# Patient Record
Sex: Male | Born: 1985 | Race: White | Hispanic: No | Marital: Married | Smoking: Current every day smoker
Health system: Southern US, Community
[De-identification: ages and names within clinical notes are randomized; demographics above are authoritative.]

## PROBLEM LIST (undated history)

## (undated) DIAGNOSIS — K219 Gastro-esophageal reflux disease without esophagitis: Secondary | ICD-10-CM

## (undated) HISTORY — PX: HAND SURGERY: SHX662

## (undated) HISTORY — PX: COSMETIC SURGERY: SHX468

---

## 2009-06-07 ENCOUNTER — Emergency Department (HOSPITAL_COMMUNITY): Admission: EM | Admit: 2009-06-07 | Discharge: 2009-06-07 | Payer: Self-pay | Admitting: Emergency Medicine

## 2009-08-27 ENCOUNTER — Emergency Department (HOSPITAL_COMMUNITY): Admission: EM | Admit: 2009-08-27 | Discharge: 2009-08-27 | Payer: Self-pay | Admitting: Emergency Medicine

## 2009-08-29 ENCOUNTER — Emergency Department (HOSPITAL_COMMUNITY): Admission: EM | Admit: 2009-08-29 | Discharge: 2009-08-29 | Payer: Self-pay | Admitting: Emergency Medicine

## 2009-09-16 ENCOUNTER — Emergency Department (HOSPITAL_COMMUNITY): Admission: EM | Admit: 2009-09-16 | Discharge: 2009-09-16 | Payer: Self-pay | Admitting: *Deleted

## 2011-02-13 ENCOUNTER — Emergency Department (HOSPITAL_COMMUNITY)
Admission: EM | Admit: 2011-02-13 | Discharge: 2011-02-13 | Disposition: A | Discharge status: Home / Self Care | Payer: Self-pay | Attending: Emergency Medicine | Admitting: Emergency Medicine

## 2011-02-13 DIAGNOSIS — K089 Disorder of teeth and supporting structures, unspecified: Secondary | ICD-10-CM | POA: Insufficient documentation

## 2011-03-07 LAB — RAPID URINE DRUG SCREEN, HOSP PERFORMED
Amphetamines: NOT DETECTED
Barbiturates: NOT DETECTED
Benzodiazepines: NOT DETECTED
Cocaine: NOT DETECTED
Opiates: NOT DETECTED
Tetrahydrocannabinol: POSITIVE — AB

## 2011-03-07 LAB — BASIC METABOLIC PANEL
BUN: 3 mg/dL — ABNORMAL LOW (ref 6–23)
CO2: 24 mEq/L (ref 19–32)
Calcium: 9.2 mg/dL (ref 8.4–10.5)
Chloride: 106 mEq/L (ref 96–112)
Creatinine, Ser: 0.83 mg/dL (ref 0.4–1.5)
GFR calc Af Amer: >60 mL/min (ref >60)
GFR calc non Af Amer: >60 mL/min (ref >60)
Glucose, Bld: 99 mg/dL (ref 70–99)
Potassium: 3.3 mEq/L — ABNORMAL LOW (ref 3.5–5.1)
Sodium: 140 mEq/L (ref 135–145)

## 2011-03-07 LAB — ETHANOL: Alcohol, Ethyl (B): 118 mg/dL — ABNORMAL HIGH (ref 0–10)

## 2011-03-08 LAB — POCT I-STAT, CHEM 8
BUN: 6 mg/dL (ref 6–23)
Calcium, Ion: 1.15 mmol/L (ref 1.12–1.32)
Chloride: 102 mEq/L (ref 96–112)
Creatinine, Ser: 1 mg/dL (ref 0.4–1.5)
Glucose, Bld: 91 mg/dL (ref 70–99)
HCT: 52 % (ref 39.0–52.0)
Hemoglobin: 17.7 g/dL — ABNORMAL HIGH (ref 13.0–17.0)
Potassium: 4.2 mEq/L (ref 3.5–5.1)
Sodium: 138 mEq/L (ref 135–145)
TCO2: 26 mmol/L (ref 0–100)

## 2011-03-08 LAB — CBC
HCT: 47.3 % (ref 39.0–52.0)
Hemoglobin: 16.2 g/dL (ref 13.0–17.0)
MCHC: 34.2 g/dL (ref 30.0–36.0)
MCV: 87.3 fL (ref 78.0–100.0)
Platelets: 141 10*3/uL — ABNORMAL LOW (ref 150–400)
RBC: 5.42 MIL/uL (ref 4.22–5.81)
RDW: 13.6 % (ref 11.5–15.5)
WBC: 18.8 10*3/uL — ABNORMAL HIGH (ref 4.0–10.5)

## 2011-03-08 LAB — DIFFERENTIAL
Basophils Absolute: 0.1 10*3/uL (ref 0.0–0.1)
Basophils Relative: 0 % (ref 0–1)
Eosinophils Absolute: 0.4 10*3/uL (ref 0.0–0.7)
Eosinophils Relative: 2 % (ref 0–5)
Lymphocytes Relative: 12 % (ref 12–46)
Lymphs Abs: 2.2 10*3/uL (ref 0.7–4.0)
Monocytes Absolute: 1.3 10*3/uL — ABNORMAL HIGH (ref 0.1–1.0)
Monocytes Relative: 7 % (ref 3–12)
Neutro Abs: 14.9 10*3/uL — ABNORMAL HIGH (ref 1.7–7.7)
Neutrophils Relative %: 79 % — ABNORMAL HIGH (ref 43–77)

## 2011-12-14 ENCOUNTER — Encounter (HOSPITAL_COMMUNITY): Payer: Self-pay | Admitting: *Deleted

## 2011-12-14 ENCOUNTER — Emergency Department (HOSPITAL_COMMUNITY)
Admission: EM | Admit: 2011-12-14 | Discharge: 2011-12-14 | Disposition: A | Discharge status: Home / Self Care | Payer: Self-pay | Attending: Emergency Medicine | Admitting: Emergency Medicine

## 2011-12-14 DIAGNOSIS — R51 Headache: Secondary | ICD-10-CM | POA: Insufficient documentation

## 2011-12-14 DIAGNOSIS — R112 Nausea with vomiting, unspecified: Secondary | ICD-10-CM | POA: Insufficient documentation

## 2011-12-14 DIAGNOSIS — R109 Unspecified abdominal pain: Secondary | ICD-10-CM | POA: Insufficient documentation

## 2011-12-14 MED ORDER — ONDANSETRON HCL 4 MG/2ML IJ SOLN
4.0000 mg | Freq: Once | INTRAMUSCULAR | Status: AC
  Administered 2011-12-14: 4 mg via INTRAVENOUS
  Filled 2011-12-14: qty 2

## 2011-12-14 MED ORDER — HYDROMORPHONE HCL PF 1 MG/ML IJ SOLN
1.0000 mg | Freq: Once | INTRAMUSCULAR | Status: DC
  Filled 2011-12-14: qty 1

## 2011-12-14 MED ORDER — DICYCLOMINE HCL 20 MG PO TABS: 20.0000 mg | ORAL_TABLET | Freq: Four times a day (QID) | ORAL | Status: DC | PRN

## 2011-12-14 MED ORDER — ONDANSETRON HCL 4 MG PO TABS: 4.0000 mg | ORAL_TABLET | Freq: Four times a day (QID) | ORAL | Status: AC

## 2011-12-14 MED ORDER — SODIUM CHLORIDE 0.9 % IV BOLUS (SEPSIS)
2000.0000 mL | Freq: Once | INTRAVENOUS | Status: AC
  Administered 2011-12-14: 1000 mL via INTRAVENOUS

## 2011-12-14 NOTE — ED Notes (Addendum)
Pt states was sent home from work last night secondary to emesis. Pt reports c/o "blood in vomit at 1700 tonight" and increasing  abd pain. Unsure of last BM,  States seen "blood "in last stool does have hemmoroids.  Pt also c/o of headache.

## 2011-12-14 NOTE — ED Notes (Signed)
Pt not tolerating IV site; pt became diaphoretic with placement; pt at this time still anxious and diaphoretic with increased Respirations.  MD aware. Zofran given for nausea. Dilaudid not given at this time. MD aware Will monitor. Spouse at bedside. Pt responding to claming techniques

## 2011-12-14 NOTE — ED Provider Notes (Signed)
History    25yM with n/v. Onset yesterday. Multiple episodes. Noticed today after vomiting spit up small amount of blood. Mild abdominal pain. "Just feels really sore." Continued through today. No urinary complaints. No diarrhea. No fever or chills. Mild HA. No CP or SOB. Denies hx of abdominal surgery. No urinary complaints.  CSN: [id number]  Arrival date & time 12/14/11  1900   First MD Initiated Contact with Patient 12/14/11 2108      Chief Complaint  Patient presents with  . Emesis  . Abdominal Pain    (Consider location/radiation/quality/duration/timing/severity/associated sxs/prior treatment) HPI  History reviewed. No pertinent past medical history.  Past Surgical History  Procedure Date  . Cosmetic surgery     No family history on file.  History  Substance Use Topics  . Smoking status: Current Everyday Smoker -- 1.0 packs/day    Types: Cigarettes  . Smokeless tobacco: Not on file  . Alcohol Use: No      Review of Systems   Review of symptoms negative unless otherwise noted in HPI.   Allergies  Review of patient's allergies indicates not on file.  Home Medications  No current outpatient prescriptions on file.  BP 127/84  Pulse 71  Temp(Src) 98 F (36.7 C) (Oral)  Resp 18  Ht 5\' 11"  (1.803 m)  Wt 225 lb (102.059 kg)  BMI 31.38 kg/m2  SpO2 100%  Physical Exam  Nursing note and vitals reviewed. Constitutional: He appears well-developed and well-nourished. No distress.       Sitting up in bed. nad.  HENT:  Head: Normocephalic and atraumatic.  Eyes: Conjunctivae are normal. Right eye exhibits no discharge. Left eye exhibits no discharge.  Neck: Neck supple.  Cardiovascular: Normal rate, regular rhythm and normal heart sounds.  Exam reveals no gallop and no friction rub.   No murmur heard. Pulmonary/Chest: Effort normal and breath sounds normal. No respiratory distress.  Abdominal: Soft. He exhibits no distension and no mass. There is no  tenderness. There is no guarding.  Genitourinary:       No cva tenderness  Musculoskeletal: He exhibits no edema and no tenderness.  Neurological: He is alert.  Skin: Skin is warm and dry. He is not diaphoretic.  Psychiatric: He has a normal mood and affect. His behavior is normal. Thought content normal.    ED Course  Procedures (including critical care time)  Labs Reviewed - No data to display No results found.   1. Nausea and vomiting       MDM  25yM with vomiting for about a day. Abdomen benign. Denies acute abdominal surgical process. Likley viral illness. Better after meds and IVF. Plan continues symptomatic tx. Return precautions discussed.        Raeford Razor, MD 12/20/11 1034

## 2011-12-14 NOTE — ED Notes (Signed)
Pt stable at discharge IV site from infiltrate less edema noted instructions given to pt/family. No redness noted. App 200 cc NS infilrated

## 2015-01-09 ENCOUNTER — Encounter (HOSPITAL_COMMUNITY): Payer: Self-pay | Admitting: *Deleted

## 2015-01-09 ENCOUNTER — Emergency Department (HOSPITAL_COMMUNITY)
Admission: EM | Admit: 2015-01-09 | Discharge: 2015-01-09 | Discharge status: Against Medical Advice | Payer: Self-pay | Attending: Emergency Medicine | Admitting: Emergency Medicine

## 2015-01-09 ENCOUNTER — Emergency Department (HOSPITAL_COMMUNITY): Payer: Self-pay

## 2015-01-09 DIAGNOSIS — R200 Anesthesia of skin: Secondary | ICD-10-CM | POA: Insufficient documentation

## 2015-01-09 DIAGNOSIS — Z720 Tobacco use: Secondary | ICD-10-CM | POA: Insufficient documentation

## 2015-01-09 DIAGNOSIS — R079 Chest pain, unspecified: Secondary | ICD-10-CM | POA: Insufficient documentation

## 2015-01-09 LAB — CBC WITH DIFFERENTIAL/PLATELET
Basophils Absolute: 0.1 K/uL (ref 0.0–0.1)
Basophils Relative: 1 % (ref 0–1)
Eosinophils Absolute: 0.5 K/uL (ref 0.0–0.7)
Eosinophils Relative: 7 % — ABNORMAL HIGH (ref 0–5)
HCT: 46.4 % (ref 39.0–52.0)
Hemoglobin: 15.4 g/dL (ref 13.0–17.0)
Lymphocytes Relative: 29 % (ref 12–46)
Lymphs Abs: 2.2 K/uL (ref 0.7–4.0)
MCH: 28.3 pg (ref 26.0–34.0)
MCHC: 33.2 g/dL (ref 30.0–36.0)
MCV: 85.3 fL (ref 78.0–100.0)
Monocytes Absolute: 0.7 K/uL (ref 0.1–1.0)
Monocytes Relative: 9 % (ref 3–12)
Neutro Abs: 4.1 K/uL (ref 1.7–7.7)
Neutrophils Relative %: 54 % (ref 43–77)
Platelets: 170 K/uL (ref 150–400)
RBC: 5.44 MIL/uL (ref 4.22–5.81)
RDW: 13.6 % (ref 11.5–15.5)
WBC: 7.5 K/uL (ref 4.0–10.5)

## 2015-01-09 LAB — COMPREHENSIVE METABOLIC PANEL WITH GFR
ALT: 33 U/L (ref 0–53)
AST: 29 U/L (ref 0–37)
Albumin: 4.5 g/dL (ref 3.5–5.2)
Alkaline Phosphatase: 96 U/L (ref 39–117)
Anion gap: 2 — ABNORMAL LOW (ref 5–15)
BUN: 9 mg/dL (ref 6–23)
CO2: 26 mmol/L (ref 19–32)
Calcium: 9.2 mg/dL (ref 8.4–10.5)
Chloride: 110 mmol/L (ref 96–112)
Creatinine, Ser: 0.83 mg/dL (ref 0.50–1.35)
GFR calc Af Amer: >90 mL/min (ref >90)
GFR calc non Af Amer: >90 mL/min (ref >90)
Glucose, Bld: 95 mg/dL (ref 70–99)
Potassium: 4.2 mmol/L (ref 3.5–5.1)
Sodium: 138 mmol/L (ref 135–145)
Total Bilirubin: 0.5 mg/dL (ref 0.3–1.2)
Total Protein: 7.9 g/dL (ref 6.0–8.3)

## 2015-01-09 LAB — TROPONIN I: Troponin I: <0.03 ng/mL (ref <0.031)

## 2015-01-09 NOTE — ED Notes (Signed)
Lt chest pain onset this am,  Has had numbness in hands for 2 days,

## 2018-08-12 ENCOUNTER — Other Ambulatory Visit: Payer: Self-pay

## 2018-08-12 ENCOUNTER — Encounter (HOSPITAL_COMMUNITY): Payer: Self-pay | Admitting: Emergency Medicine

## 2018-08-12 ENCOUNTER — Emergency Department (HOSPITAL_COMMUNITY): Payer: Self-pay

## 2018-08-12 ENCOUNTER — Emergency Department (HOSPITAL_COMMUNITY)
Admission: EM | Admit: 2018-08-12 | Discharge: 2018-08-12 | Disposition: A | Discharge status: Home / Self Care | Payer: Self-pay | Attending: Emergency Medicine | Admitting: Emergency Medicine

## 2018-08-12 DIAGNOSIS — W25XXXA Contact with sharp glass, initial encounter: Secondary | ICD-10-CM | POA: Insufficient documentation

## 2018-08-12 DIAGNOSIS — Y999 Unspecified external cause status: Secondary | ICD-10-CM | POA: Insufficient documentation

## 2018-08-12 DIAGNOSIS — Y93H2 Activity, gardening and landscaping: Secondary | ICD-10-CM | POA: Insufficient documentation

## 2018-08-12 DIAGNOSIS — Z23 Encounter for immunization: Secondary | ICD-10-CM | POA: Insufficient documentation

## 2018-08-12 DIAGNOSIS — S81842A Puncture wound with foreign body, left lower leg, initial encounter: Secondary | ICD-10-CM | POA: Insufficient documentation

## 2018-08-12 DIAGNOSIS — S81812A Laceration without foreign body, left lower leg, initial encounter: Secondary | ICD-10-CM

## 2018-08-12 DIAGNOSIS — W208XXA Other cause of strike by thrown, projected or falling object, initial encounter: Secondary | ICD-10-CM | POA: Insufficient documentation

## 2018-08-12 DIAGNOSIS — T148XXA Other injury of unspecified body region, initial encounter: Secondary | ICD-10-CM

## 2018-08-12 DIAGNOSIS — Y92007 Garden or yard of unspecified non-institutional (private) residence as the place of occurrence of the external cause: Secondary | ICD-10-CM | POA: Insufficient documentation

## 2018-08-12 MED ORDER — POVIDONE-IODINE 10 % EX SOLN
CUTANEOUS | Status: AC
  Administered 2018-08-12: 09:00:00
  Filled 2018-08-12: qty 15

## 2018-08-12 MED ORDER — LIDOCAINE-EPINEPHRINE (PF) 2 %-1:200000 IJ SOLN
10.0000 mL | Freq: Once | INTRAMUSCULAR | Status: AC
  Administered 2018-08-12: 10 mL
  Filled 2018-08-12: qty 20

## 2018-08-12 MED ORDER — TETANUS-DIPHTH-ACELL PERTUSSIS 5-2.5-18.5 LF-MCG/0.5 IM SUSP
INTRAMUSCULAR | Status: AC
  Administered 2018-08-12: 0.5 mL via INTRAMUSCULAR
  Filled 2018-08-12: qty 0.5

## 2018-08-12 MED ORDER — TETANUS-DIPHTH-ACELL PERTUSSIS 5-2.5-18.5 LF-MCG/0.5 IM SUSP
0.5000 mL | Freq: Once | INTRAMUSCULAR | Status: AC
  Administered 2018-08-12: 0.5 mL via INTRAMUSCULAR

## 2018-08-12 NOTE — Discharge Instructions (Signed)
Return if any sign of infection °

## 2018-08-12 NOTE — ED Triage Notes (Signed)
Pt reports was using a weed eater yesterday and reports shattered a glass mason jar nearby. Pt reports "there may be glass in my leg." pt able to bear weight but with pain. Bleeding controlled at this time.

## 2018-08-12 NOTE — ED Notes (Signed)
Pt returned from imaging.

## 2018-08-12 NOTE — ED Notes (Signed)
EDP at bedside  

## 2018-08-12 NOTE — ED Provider Notes (Signed)
[facility] EMERGENCY DEPARTMENT Provider Note   CSN: [id number] Arrival date & time: 08/12/18  2094     History   Chief Complaint Chief Complaint  Patient presents with  . Extremity Laceration    HPI Dustin Dustin [name] is a 32 y.o. male.  The history is provided by the patient. No language interpreter was used.  Leg Pain   The current episode started yesterday. The problem occurs constantly. The problem has been gradually worsening. The pain is present in the left lower leg. The quality of the pain is described as aching. The pain is moderate. Associated symptoms include stiffness. He has tried nothing for the symptoms. The treatment provided mild relief. There has been a history of trauma.   Pt reports he was weedeating yesterday and he thinks a piece of glass went into his leg.  History reviewed. No pertinent past medical history.  There are no active problems to display for this patient.   Past Surgical History:  Procedure Laterality Date  . COSMETIC SURGERY          Home Medications    Prior to Admission medications   Medication Sig Start Date End Date Taking? Authorizing Provider  dicyclomine (BENTYL) 20 MG tablet Take 1 tablet (20 mg total) by mouth every 6 (six) hours as needed. 12/14/11 12/13/12  Raeford Razor, MD    Family History History reviewed. No pertinent family history.  Social History Social History   Tobacco Use  . Smoking status: Current Every Day Smoker    Packs/day: 1.00    Types: Cigarettes  . Smokeless tobacco: Never Used  Substance Use Topics  . Alcohol use: No  . Drug use: Yes    Types: Marijuana    Comment: last use 08/11/18     Allergies   Patient has no known allergies.   Review of Systems Review of Systems  Musculoskeletal: Positive for stiffness.  All other systems reviewed and are negative.    Physical Exam Updated Vital Signs BP 127/88 (BP Location: Left Arm)   Pulse 61   Temp 98.3 F (36.8 C) (Oral)   Resp 18    Ht 5\' 10"  (1.778 m)   Wt 99.8 kg   SpO2 100%   BMI 31.57 kg/m   Physical Exam  Constitutional: He is oriented to person, place, and time. He appears well-developed and well-nourished.  HENT:  Dustin [name]: Normocephalic.  Eyes: EOM are normal.  Neck: Normal range of motion.  Cardiovascular: Normal rate.  Pulmonary/Chest: Effort normal.  Abdominal: He exhibits no distension.  Musculoskeletal:  Puncture wound left anterior lower leg,   Neurological: He is alert and oriented to person, place, and time.  Psychiatric: He has a normal mood and affect.  Nursing note and vitals reviewed.    ED Treatments / Results  Labs (all labs ordered are listed, but only abnormal results are displayed) Labs Reviewed - No data to display  EKG None  Radiology Dg Tibia/fibula Left  Result Date: 08/12/2018 CLINICAL DATA:  Laceration.  Rule out foreign body EXAM: LEFT TIBIA AND FIBULA - 2 VIEW COMPARISON:  None. FINDINGS: Negative for fracture 1 cm rectangular foreign body in the anterior soft tissues of the distal lower leg. This is anterior to the tibia and may represent a piece of glass. No other foreign body. No gas in the soft tissues. IMPRESSION: 1 cm foreign body in the soft tissues anterior to the distal tibia, most likely glass. Negative for fracture. Electronically Signed   By: Leonette Most  Chestine Spore M.D.   On: 08/12/2018 09:08   MDM  Xray shows glass in soft tissue,  Glass removed,  Pt advised to follow up with primary, return if any problem.s Procedures .Foreign Body Removal Date/Time: 08/12/2018 10:36 AM Performed by: Elson Areas, PA-C Authorized by: Elson Areas, PA-C  Consent: Verbal consent obtained. Consent given by: patient Patient consent: the patient's understanding of the procedure matches consent given Imaging studies: imaging studies available Time out: Immediately prior to procedure a "time out" was called to verify the correct patient, procedure, equipment, support staff and  site/side marked as required. Body area: skin Anesthesia: local infiltration  Anesthesia: Local Anesthetic: lidocaine 1% with epinephrine Anesthetic total: 3 mL  Sedation: Patient sedated: no  Localization method: probed Removal mechanism: hemostat Dressing: dressing applied Tendon involvement: none Depth: deep Complexity: simple 1 objects recovered. Objects recovered: glass Post-procedure assessment: foreign body removed Patient tolerance: Patient tolerated the procedure well with no immediate complications   (including critical care time)  Medications Ordered in ED Medications  lidocaine-EPINEPHrine (XYLOCAINE W/EPI) 2 %-1:200000 (PF) injection 10 mL (10 mLs Infiltration Given by Other 08/12/18 0924)  povidone-iodine (BETADINE) 10 % external solution (  Given 08/12/18 0924)  Tdap (BOOSTRIX) injection 0.5 mL (0.5 mLs Intramuscular Given 08/12/18 0953)     Initial Impression / Assessment and Plan / ED Course  I have reviewed the triage vital signs and the nursing notes.  Pertinent labs & imaging results that were available during my care of the patient were reviewed by me and considered in my medical decision making (see chart for details).    An After Visit Summary was printed and given to the patient.    Final Clinical Impressions(s) / ED Diagnoses   Final diagnoses:  Foreign body in skin  Laceration of left lower extremity, initial encounter    ED Discharge Orders    None       Elson Areas, Cordelia Poche 08/12/18 1037    Samuel Jester, DO 08/16/18 1333

## 2018-08-12 NOTE — ED Notes (Signed)
Suture cart placed at bedside. 

## 2018-11-18 ENCOUNTER — Ambulatory Visit: Payer: Self-pay | Admitting: Family Medicine

## 2018-12-10 ENCOUNTER — Encounter: Payer: Self-pay | Admitting: Family Medicine

## 2018-12-10 ENCOUNTER — Ambulatory Visit (INDEPENDENT_AMBULATORY_CARE_PROVIDER_SITE_OTHER): Payer: Self-pay | Admitting: Family Medicine

## 2018-12-10 VITALS — BP 116/67 | HR 88 | Temp 98.8°F | Resp 16 | Ht 71.0 in | Wt 231.0 lb

## 2018-12-10 DIAGNOSIS — H60333 Swimmer's ear, bilateral: Secondary | ICD-10-CM

## 2018-12-10 DIAGNOSIS — L409 Psoriasis, unspecified: Secondary | ICD-10-CM

## 2018-12-10 DIAGNOSIS — K429 Umbilical hernia without obstruction or gangrene: Secondary | ICD-10-CM

## 2018-12-10 MED ORDER — CIPROFLOXACIN-DEXAMETHASONE 0.3-0.1 % OT SUSP: 4.0000 [drp] | Freq: Two times a day (BID) | OTIC | 0 refills | Status: AC

## 2018-12-10 MED ORDER — FLUOCINONIDE-E 0.05 % EX CREA: 1.0000 "application " | TOPICAL_CREAM | Freq: Two times a day (BID) | CUTANEOUS | 2 refills | Status: DC

## 2018-12-10 MED FILL — CIPRODEX OTIC SUSPENSION: 0.3-0.1 | 14 days supply | Qty: 8 | Fill #0

## 2018-12-10 MED FILL — FLUOCINONIDE EMULSIFIED BAS: 0.05 | 15 days supply | Qty: 30 | Fill #0

## 2018-12-10 NOTE — Patient Instructions (Signed)

## 2018-12-10 NOTE — Progress Notes (Signed)
[facility]  New Patient Encounter Provider: Mike Gipndre Karon Heckendorn, FNP    ZOX:[id number]  [id number]:[id number]  DOB - 08/19/1986  SUBJECTIVE:   Dustin [name], is a 33 y.o. male who presents to establish care with this clinic.   Current problems/concerns:  Patient states that he has noticed a foul-smelling discharge coming from his bilateral ears.  He states he cleans his ears daily with a Bobby pin.  Patient states that he has decreased hearing in bilateral ears.  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  He does not wear ear protection.  Patient also states that he has a history of an umbilical hernia for the past 2 years with intermittent tingling pain.  Patient states that the pain has increased within the past few weeks.  He does report straining with bowel movements.  He often lifts heavy objects. Patient states that he has not been seen by a provider since he was a child.  He is not fasting today for labs.  Also states history of psoriasis and multiple moles.  No Known Allergies History reviewed. No pertinent past medical history. No current outpatient medications on file prior to visit.   No current facility-administered medications on file prior to visit.    History reviewed. No pertinent family history. Social History   Socioeconomic History  . Marital status: Married    Spouse name: Not on file  . Number of children: Not on file  . Years of education: Not on file  . Highest education level: Not on file  Occupational History  . Not on file  Social Needs  . Financial resource strain: Not on file  . Food insecurity:    Worry: Not on file    Inability: Not on file  . Transportation needs:    Medical: Not on file    Non-medical: Not on file  Tobacco Use  . Smoking status: Current Every Day Smoker    Packs/day: 2.00    Types: Cigarettes  . Smokeless tobacco: Never Used  Substance and Sexual Activity  . Alcohol  use: No  . Drug use: Yes    Types: Marijuana    Comment: daily  . Sexual activity: Yes    Birth control/protection: Condom  Lifestyle  . Physical activity:    Days per week: Not on file    Minutes per session: Not on file  . Stress: Not on file  Relationships  . Social connections:    Talks on phone: Not on file    Gets together: Not on file    Attends religious service: Not on file    Active member of club or organization: Not on file    Attends meetings of clubs or organizations: Not on file    Relationship status: Not on file  . Intimate partner violence:    Fear of current or ex partner: Not on file    Emotionally abused: Not on file    Physically abused: Not on file    Forced sexual activity: Not on file  Other Topics Concern  . Not on file  Social History Narrative  . Not on file    Review of Systems  Constitutional: Negative.   HENT: Positive for ear discharge, ear pain and hearing loss.   Eyes: Negative.   Respiratory: Negative.   Cardiovascular: Negative.   Gastrointestinal: Positive for abdominal pain.  Genitourinary: Negative.   Musculoskeletal: Negative.   Skin: Positive for rash (Psoriatic rash  noted to left leg).  Neurological: Negative.   Psychiatric/Behavioral: Negative.      OBJECTIVE:    BP 116/67 (BP Location: Right Arm, Patient Position: Sitting, Cuff Size: Large)   Pulse 88   Temp 98.8 F (37.1 C) (Oral)   Resp 16   Ht 5\' 11"  (1.803 m)   Wt 231 lb (104.8 kg)   SpO2 100%   BMI 32.22 kg/m   Physical Exam  Constitutional: He is oriented to person, place, and time and well-developed, well-nourished, and in no distress. No distress.  HENT:  Head: Normocephalic and atraumatic.  Right Ear: Hearing normal. There is drainage, swelling and tenderness.  Left Ear: Hearing normal. There is drainage, swelling and tenderness.  Bilateral TMs unable to visualize due to swelling of the ear canals. Fluffy white to yellow exudate noted bilaterally.     Eyes: Pupils are equal, round, and reactive to light. Conjunctivae and EOM are normal.  Neck: Normal range of motion. Neck supple.  Cardiovascular: Normal rate, regular rhythm and intact distal pulses. Exam reveals no gallop and no friction rub.  No murmur heard. Pulmonary/Chest: Effort normal and breath sounds normal. No respiratory distress. He has no wheezes.  Abdominal: Soft. Bowel sounds are normal. There is abdominal tenderness in the periumbilical area. A hernia is present. Hernia confirmed positive in the umbilical area.  Musculoskeletal: Normal range of motion.        General: No tenderness or edema.  Lymphadenopathy:    He has no cervical adenopathy.  Neurological: He is alert and oriented to person, place, and time. Gait normal.  Skin: Skin is warm and dry.     Psychiatric: Mood, memory, affect and judgment normal.  Nursing note and vitals reviewed.    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  - ciprofloxacin-dexamethasone (CIPRODEX) OTIC suspension; Place 4 drops into both ears 2 (two) times daily for 14 days.  Dispense: 7.5 mL; Refill: 0  2. Psoriasis Patient states that he has tried creams in the past without relief. Will try lidex.  - fluocinonide-emollient (LIDEX-E) 0.05 % cream; Apply 1 application topically 2 (two) times daily.  Dispense: 30 g; Refill: 2  3. Umbilical hernia without obstruction and without gangrene Patient advised to apply for orange card and cone discount so that he can be referred for further evaluation.  - US Abdomen Complete; Future  Return in about 2 weeks (around 12/24/2018) for ear.  The patient was given clear instructions to go to ER or return to medical center if symptoms don't improve, worsen or new problems develop. The patient verbalized understanding. The patient was told to call to get lab results if they haven't heard anything in the next week.     This note has been created with  Education officer, environmental. Any transcriptional errors are unintentional.   Ms. Andr L. Riley Lam, FNP-BC [facility] [address] [place], [place] [address] [phone]

## 2018-12-18 ENCOUNTER — Ambulatory Visit (HOSPITAL_COMMUNITY): Payer: Self-pay

## 2018-12-22 ENCOUNTER — Other Ambulatory Visit: Payer: Self-pay | Admitting: Family Medicine

## 2018-12-22 ENCOUNTER — Ambulatory Visit (HOSPITAL_COMMUNITY)
Admission: RE | Admit: 2018-12-22 | Discharge: 2018-12-22 | Disposition: A | Discharge status: Home / Self Care | Payer: Self-pay | Source: Ambulatory Visit | Attending: Family Medicine | Admitting: Family Medicine

## 2018-12-22 DIAGNOSIS — K429 Umbilical hernia without obstruction or gangrene: Secondary | ICD-10-CM

## 2018-12-22 DIAGNOSIS — K4090 Unilateral inguinal hernia, without obstruction or gangrene, not specified as recurrent: Secondary | ICD-10-CM

## 2018-12-25 ENCOUNTER — Ambulatory Visit: Payer: Self-pay | Admitting: Family Medicine

## 2019-01-01 ENCOUNTER — Ambulatory Visit: Payer: Self-pay | Admitting: Family Medicine

## 2019-01-06 ENCOUNTER — Emergency Department (HOSPITAL_COMMUNITY): Payer: Self-pay

## 2019-01-06 ENCOUNTER — Emergency Department (HOSPITAL_COMMUNITY)
Admission: EM | Admit: 2019-01-06 | Discharge: 2019-01-06 | Disposition: A | Discharge status: Home / Self Care | Payer: Self-pay | Attending: Emergency Medicine | Admitting: Emergency Medicine

## 2019-01-06 ENCOUNTER — Encounter (HOSPITAL_COMMUNITY): Payer: Self-pay | Admitting: Emergency Medicine

## 2019-01-06 ENCOUNTER — Other Ambulatory Visit: Payer: Self-pay

## 2019-01-06 DIAGNOSIS — Y929 Unspecified place or not applicable: Secondary | ICD-10-CM | POA: Insufficient documentation

## 2019-01-06 DIAGNOSIS — W51XXXA Accidental striking against or bumped into by another person, initial encounter: Secondary | ICD-10-CM | POA: Insufficient documentation

## 2019-01-06 DIAGNOSIS — Y9383 Activity, rough housing and horseplay: Secondary | ICD-10-CM | POA: Insufficient documentation

## 2019-01-06 DIAGNOSIS — S62612A Displaced fracture of proximal phalanx of right middle finger, initial encounter for closed fracture: Secondary | ICD-10-CM | POA: Insufficient documentation

## 2019-01-06 DIAGNOSIS — F17210 Nicotine dependence, cigarettes, uncomplicated: Secondary | ICD-10-CM | POA: Insufficient documentation

## 2019-01-06 DIAGNOSIS — Y999 Unspecified external cause status: Secondary | ICD-10-CM | POA: Insufficient documentation

## 2019-01-06 DIAGNOSIS — J45909 Unspecified asthma, uncomplicated: Secondary | ICD-10-CM | POA: Insufficient documentation

## 2019-01-06 MED ORDER — OXYCODONE-ACETAMINOPHEN 5-325 MG PO TABS
1.0000 | ORAL_TABLET | Freq: Once | ORAL | Status: AC
  Administered 2019-01-06: 1 via ORAL
  Filled 2019-01-06: qty 1

## 2019-01-06 MED ORDER — HYDROCODONE-ACETAMINOPHEN 5-325 MG PO TABS: ORAL_TABLET | ORAL | 0 refills | Status: DC

## 2019-01-06 MED ORDER — IBUPROFEN 800 MG PO TABS: 800.0000 mg | ORAL_TABLET | Freq: Three times a day (TID) | ORAL | 0 refills | Status: DC

## 2019-01-06 NOTE — ED Provider Notes (Signed)
[facility] EMERGENCY DEPARTMENT Provider Note   CSN: [id number] Arrival date & time: 01/06/19  1541     History   Chief Complaint Chief Complaint  Patient presents with  . Finger Injury    HPI AHIJAH OVER is a 33 y.o. male.  HPI   LEWAYNE SHORES is a 33 y.o. male who presents to the Emergency Department complaining of pain and swelling of the proximal right middle finger.  He states that he was horse playing with a friend and mechanism of injury is unclear, but he believes that his friends fist struck his finger.  He reports pain along his knuckle it is worse with movement of his third and fourth fingers.  No open wounds.  He states that he was unable to hold his third finger straight, and his friend pulled on his finger thinking it was dislocated.  Incident occurred earlier this afternoon.  He denies numbness or weakness of his fingers, wrist pain.  He is left-hand dominant.  He denies other injuries.    History reviewed. No pertinent past medical history.  There are no active problems to display for this patient.   Past Surgical History:  Procedure Laterality Date  . COSMETIC SURGERY        Home Medications    Prior to Admission medications   Medication Sig Start Date End Date Taking? Authorizing Provider  fluocinonide-emollient (LIDEX-E) 0.05 % cream Apply 1 application topically 2 (two) times daily. 12/10/18   Mike Gip, FNP    Family History No family history on file.  Social History Social History   Tobacco Use  . Smoking status: Current Every Day Smoker    Packs/day: 2.00    Types: Cigarettes  . Smokeless tobacco: Never Used  Substance Use Topics  . Alcohol use: No  . Drug use: Yes    Types: Marijuana    Comment: daily     Allergies   Patient has no known allergies.   Review of Systems Review of Systems  Constitutional: Negative for chills and fever.  Musculoskeletal: Positive for arthralgias (Pain and swelling of the proximal right  middle finger) and joint swelling.  Skin: Negative for color change and wound.  Neurological: Negative for weakness and numbness.     Physical Exam Updated Vital Signs BP 128/76 (BP Location: Left Arm)   Pulse 75   Temp 98.3 F (36.8 C) (Temporal)   Resp 18   Ht 5\' 11"  (1.803 m)   Wt 99.8 kg   SpO2 96%   BMI 30.68 kg/m   Physical Exam Vitals signs and nursing note reviewed.  Constitutional:      General: He is not in acute distress.    Appearance: Normal appearance.  HENT:     Head: Normocephalic.  Neck:     Musculoskeletal: Normal range of motion.  Cardiovascular:     Rate and Rhythm: Normal rate and regular rhythm.     Pulses: Normal pulses.  Pulmonary:     Effort: Pulmonary effort is normal.     Breath sounds: Normal breath sounds.  Musculoskeletal:        General: Swelling, tenderness and signs of injury present.     Right hand: He exhibits decreased range of motion, tenderness, bony tenderness and swelling. He exhibits normal two-point discrimination. Normal sensation noted.       Hands:     Comments: Tenderness to palpation along the proximal middle finger and metacarpal head.  Mild to moderate edema noted.  No  open wound.  Pain reproduced with flexion extension of the middle finger.  Wrist is nontender  Skin:    General: Skin is warm.     Capillary Refill: Capillary refill takes less than 2 seconds.     Findings: No erythema or rash.  Neurological:     General: No focal deficit present.     Mental Status: He is alert.     Sensory: No sensory deficit.     Motor: No weakness.      ED Treatments / Results  Labs (all labs ordered are listed, but only abnormal results are displayed) Labs Reviewed - No data to display  EKG None  Radiology Dg Hand Complete Right  Result Date: 01/06/2019 CLINICAL DATA:  Hand pain following wrestling, initial encounter EXAM: RIGHT HAND - COMPLETE 3+ VIEW COMPARISON:  None. FINDINGS: There are changes consistent with prior  healed fifth metacarpal fracture. There is an acute fracture at the base of the third proximal phalanx with only minimal displacement identified. No other fractures are seen. IMPRESSION: Acute third proximal phalangeal fracture. Changes of healed fifth metacarpal fracture. Electronically Signed   By: Alcide Clever M.D.   On: 01/06/2019 16:30    Procedures Procedures (including critical care time)  Medications Ordered in ED Medications - No data to display   Initial Impression / Assessment and Plan / ED Course  I have reviewed the triage vital signs and the nursing notes.  Pertinent labs & imaging results that were available during my care of the patient were reviewed by me and considered in my medical decision making (see chart for details).     Patient with injury of the right third finger.  X-ray shows fracture of the proximal phalanx with minimal displacement.  Neurovascularly intact.  No open wound.  I will consult hand surgeon  1745  Consulted Dr. Melvyn Novas.  Recommended finger splint and will see in office for follow-up  Pt agrees to plan, elevate, ice and short course of pain medication provided.   Final Clinical Impressions(s) / ED Diagnoses   Final diagnoses:  Closed displaced fracture of proximal phalanx of right middle finger, initial encounter    ED Discharge Orders    None       Pauline Aus, PA-C 01/07/19 2315    Vanetta Mulders, MD 01/08/19 1527

## 2019-01-06 NOTE — ED Triage Notes (Signed)
Pain to middle finger on RT hand after horse playing with a friend

## 2019-01-06 NOTE — ED Notes (Signed)
Patient seen and evaluated by EDPa for initial assessment. 

## 2019-01-06 NOTE — Discharge Instructions (Addendum)
Try to keep your hand elevated when possible.  Keep the fingers splinted.  Call Dr. Bari Edward office to arrange a follow-up appointment.

## 2019-01-07 ENCOUNTER — Telehealth: Payer: Self-pay

## 2019-01-07 NOTE — Telephone Encounter (Signed)
Called, no answer. Left a message for patient to call back. Thanks!  

## 2019-01-07 NOTE — Telephone Encounter (Signed)
-----   Message from Mike Gip, FNP sent at 01/06/2019  5:25 PM EST ----- Patient has hernia. Can send to general surgery if he can apply for the cone discount and orange card. Please call and see if he has applied and if he would like to move forward with this.

## 2019-08-11 ENCOUNTER — Encounter (HOSPITAL_COMMUNITY): Payer: Self-pay | Admitting: *Deleted

## 2019-08-11 ENCOUNTER — Encounter (HOSPITAL_COMMUNITY): Payer: Self-pay

## 2020-04-01 ENCOUNTER — Emergency Department (HOSPITAL_COMMUNITY)
Admission: EM | Admit: 2020-04-01 | Discharge: 2020-04-01 | Disposition: A | Discharge status: Home / Self Care | Payer: Self-pay | Attending: Emergency Medicine | Admitting: Emergency Medicine

## 2020-04-01 ENCOUNTER — Other Ambulatory Visit: Payer: Self-pay

## 2020-04-01 ENCOUNTER — Encounter (HOSPITAL_COMMUNITY): Payer: Self-pay | Admitting: *Deleted

## 2020-04-01 DIAGNOSIS — L089 Local infection of the skin and subcutaneous tissue, unspecified: Secondary | ICD-10-CM | POA: Insufficient documentation

## 2020-04-01 DIAGNOSIS — L729 Follicular cyst of the skin and subcutaneous tissue, unspecified: Secondary | ICD-10-CM | POA: Insufficient documentation

## 2020-04-01 DIAGNOSIS — F17210 Nicotine dependence, cigarettes, uncomplicated: Secondary | ICD-10-CM | POA: Insufficient documentation

## 2020-04-01 MED ORDER — LIDOCAINE-EPINEPHRINE (PF) 2 %-1:200000 IJ SOLN
20.0000 mL | Freq: Once | INTRAMUSCULAR | Status: AC
  Administered 2020-04-01: 20 mL via INTRADERMAL
  Filled 2020-04-01: qty 20

## 2020-04-01 MED ORDER — ONDANSETRON 4 MG PO TBDP
4.0000 mg | ORAL_TABLET | Freq: Once | ORAL | Status: AC
  Administered 2020-04-01: 4 mg via ORAL
  Filled 2020-04-01: qty 1

## 2020-04-01 MED ORDER — DOXYCYCLINE HYCLATE 100 MG PO TABS
100.0000 mg | ORAL_TABLET | Freq: Once | ORAL | Status: AC
  Administered 2020-04-01: 100 mg via ORAL
  Filled 2020-04-01: qty 1

## 2020-04-01 MED ORDER — DOXYCYCLINE HYCLATE 100 MG PO CAPS: 100.0000 mg | ORAL_CAPSULE | Freq: Two times a day (BID) | ORAL | 0 refills | Status: DC

## 2020-04-01 NOTE — ED Notes (Signed)
Pt in bed, sig other at bedside, pt states that he is feeling better, states that he is ready to go home, pt had some juice, pt color is appropriate for race.  Pt ambulatory from dpt with steady gait.

## 2020-04-01 NOTE — Discharge Instructions (Signed)
Keep wound clean with warm soap and water and keep bandage dry, do not submerge in water for 24 hours. Change bandage sooner if it gets dirty You can take Tylenol or ibuprofen for pain Take Doxycycline twice daily for one week for infection Return for fever, increased redness, swelling, pain, or worsening drainage

## 2020-04-01 NOTE — ED Notes (Signed)
Pt in bed, pt has 10 by 12 inch area of redness, pt also has 4 by 6 inch area of induration with a 2 by3 inch abscess on his L leg.  Pt states that he is also having some pain in his groin.  Pt has multiple areas of psoriasis.

## 2020-04-01 NOTE — ED Triage Notes (Signed)
Pt with large abscess to left posterior thigh for past 2 days.  Pt with nausea off and on, denies fevers at home.

## 2020-04-01 NOTE — ED Notes (Signed)
abx given, crackers given, pt stood up and became pale and nauseated, pt sat back down, PA notified of this and BP, hold dc, pt states that he will take zofran at this time.

## 2020-04-01 NOTE — ED Provider Notes (Addendum)
[facility] EMERGENCY DEPARTMENT Provider Note   CSN: [id number] Arrival date & time: 04/01/20  1944     History Chief Complaint  Patient presents with  . Abscess    Dustin [name] is a 34 y.o. male who presents with an infected cyst. Pt has had a cyst on the back of his left thigh for a long time but over the past couple days it has become swollen, red, very painful. Nothing makes it better. Palpation makes it worse. He has had a cyst on his face which required I&D before. He has psoriasis which is untreated because it usually doesn't bother him. He works in [employer]. No fever.  HPI     History reviewed. No pertinent past medical history.  There are no problems to display for this patient.   Past Surgical History:  Procedure Laterality Date  . COSMETIC SURGERY         History reviewed. No pertinent family history.  Social History   Tobacco Use  . Smoking status: Current Every Day Smoker    Packs/day: 2.00    Types: Cigarettes  . Smokeless tobacco: Never Used  Substance Use Topics  . Alcohol use: No  . Drug use: Yes    Types: Marijuana    Comment: daily    Home Medications Prior to Admission medications   Medication Sig Start Date End Date Taking? Authorizing Provider  fluocinonide-emollient (LIDEX-E) 0.05 % cream Apply 1 application topically 2 (two) times daily. 12/10/18   Mike Gip, FNP  HYDROcodone-acetaminophen (NORCO/VICODIN) 5-325 MG tablet Take one tab po q 4 hrs prn pain 01/06/19   Triplett, Tammy, PA-C  ibuprofen (ADVIL,MOTRIN) 800 MG tablet Take 1 tablet (800 mg total) by mouth 3 (three) times daily. 01/06/19   Triplett, Tammy, PA-C    Allergies    Patient has no known allergies.  Review of Systems   Review of Systems  Constitutional: Negative for fever.  Skin: Positive for rash.       +infected cyst  All other systems reviewed and are negative.   Physical Exam Updated Vital Signs BP 127/77 (BP Location: Right Arm)   Pulse 100    Temp 98.7 F (37.1 C) (Dustin)   Resp 16   Ht 5\' 11"  (1.803 m)   Wt 106.1 kg   SpO2 100%   BMI 32.64 kg/m   Physical Exam Vitals and nursing note reviewed.  Constitutional:      General: He is not in acute distress.    Appearance: Normal appearance. He is well-developed. He is not ill-appearing.  HENT:     Head: Normocephalic and atraumatic.  Eyes:     General: No scleral icterus.       Right eye: No discharge.        Left eye: No discharge.     Conjunctiva/sclera: Conjunctivae normal.     Pupils: Pupils are equal, round, and reactive to light.  Cardiovascular:     Rate and Rhythm: Normal rate.  Pulmonary:     Effort: Pulmonary effort is normal. No respiratory distress.  Abdominal:     General: There is no distension.  Musculoskeletal:     Cervical back: Normal range of motion.  Skin:    General: Skin is warm and dry.     Comments: ~5x5cm tender, fluctuant, erythematous inflamed cyst over the L posterior thigh with significant surrounding erythema and induration.   Multiple scaly circular lesions over the extremities consistent with psorasis.  Neurological:     Mental  Status: He is alert and oriented to person, place, and time.  Psychiatric:        Behavior: Behavior normal.     ED Results / Procedures / Treatments   Labs (all labs ordered are listed, but only abnormal results are displayed) Labs Reviewed - No data to display  EKG None  Radiology No results found.  Procedures .Marland KitchenIncision and Drainage  Date/Time: 04/01/2020 10:01 PM Performed by: Recardo Evangelist, PA-C Authorized by: Recardo Evangelist, PA-C   Consent:    Consent obtained:  Verbal   Consent given by:  Patient   Risks discussed:  Bleeding, incomplete drainage, pain and damage to other organs   Alternatives discussed:  No treatment Universal protocol:    Procedure explained and questions answered to patient or proxy's satisfaction: yes     Relevant documents present and verified: yes      Test results available and properly labeled: yes     Imaging studies available: yes     Required blood products, implants, devices, and special equipment available: yes     Site/side marked: yes     Immediately prior to procedure a time out was called: yes     Patient identity confirmed:  Verbally with patient Location:    Type:  Cyst   Size:  5x5   Location:  Lower extremity   Lower extremity location:  Leg   Leg location:  L upper leg Pre-procedure details:    Skin preparation:  Betadine Anesthesia (see MAR for exact dosages):    Anesthesia method:  Local infiltration   Local anesthetic:  Lidocaine 2% WITH epi Procedure type:    Complexity:  Simple Procedure details:    Incision types:  Single straight   Incision depth:  Subcutaneous   Scalpel blade:  11   Wound management:  Probed and deloculated, irrigated with saline and extensive cleaning   Drainage:  Purulent (cystic contents)   Drainage amount:  Copious   Wound treatment:  Wound left open   Packing materials:  None Post-procedure details:    Patient tolerance of procedure:  Tolerated with difficulty Comments:     Pt had multiple episodes of vomiting secondary to pain of the procedure   (including critical care time)    Medications Ordered in ED Medications  lidocaine-EPINEPHrine (XYLOCAINE W/EPI) 2 %-1:200000 (PF) injection 20 mL (has no administration in time range)  doxycycline (VIBRA-TABS) tablet 100 mg (has no administration in time range)    ED Course  I have reviewed the triage vital signs and the nursing notes.  Pertinent labs & imaging results that were available during my care of the patient were reviewed by me and considered in my medical decision making (see chart for details).  34 year old male presents with an infected cyst on the posterior left thigh.  Cyst is large and there is significant surrounding erythema induration.  Patient consented to incision and drainage.  He was prepped and draped.   I&D was performed and copious cystic material and purulent drainage was expressed.  Patient did have multiple episodes vomiting during the procedure.  The cystic cavity was irrigated with sterile water and a sterile dressing was applied.  Patient was given a dose of antibiotic here and prescription for home.  He was instructed on wound care and advised to return for worsening symptoms.  MDM Rules/Calculators/A&P                       Final Clinical Impression(s) /  ED Diagnoses Final diagnoses:  None    Rx / DC Orders ED Discharge Orders    None       Bethel Born, PA-C 04/01/20 2203    Bethel Born, PA-C 04/01/20 2204    Maia Plan, MD 04/04/20 [phone]

## 2020-06-01 ENCOUNTER — Emergency Department (HOSPITAL_COMMUNITY): Payer: Self-pay

## 2020-06-01 ENCOUNTER — Other Ambulatory Visit: Payer: Self-pay

## 2020-06-01 ENCOUNTER — Observation Stay (HOSPITAL_COMMUNITY)
Admission: EM | Admit: 2020-06-01 | Discharge: 2020-06-02 | Disposition: A | Discharge status: Home / Self Care | Payer: Self-pay | Attending: General Surgery | Admitting: General Surgery

## 2020-06-01 DIAGNOSIS — F17210 Nicotine dependence, cigarettes, uncomplicated: Secondary | ICD-10-CM | POA: Insufficient documentation

## 2020-06-01 DIAGNOSIS — R079 Chest pain, unspecified: Secondary | ICD-10-CM

## 2020-06-01 DIAGNOSIS — S2241XA Multiple fractures of ribs, right side, initial encounter for closed fracture: Secondary | ICD-10-CM | POA: Insufficient documentation

## 2020-06-01 DIAGNOSIS — M4802 Spinal stenosis, cervical region: Secondary | ICD-10-CM | POA: Insufficient documentation

## 2020-06-01 DIAGNOSIS — K429 Umbilical hernia without obstruction or gangrene: Secondary | ICD-10-CM | POA: Insufficient documentation

## 2020-06-01 DIAGNOSIS — S270XXA Traumatic pneumothorax, initial encounter: Principal | ICD-10-CM | POA: Insufficient documentation

## 2020-06-01 DIAGNOSIS — S42101A Fracture of unspecified part of scapula, right shoulder, initial encounter for closed fracture: Secondary | ICD-10-CM

## 2020-06-01 DIAGNOSIS — N62 Hypertrophy of breast: Secondary | ICD-10-CM | POA: Insufficient documentation

## 2020-06-01 DIAGNOSIS — I4510 Unspecified right bundle-branch block: Secondary | ICD-10-CM | POA: Insufficient documentation

## 2020-06-01 DIAGNOSIS — S42009A Fracture of unspecified part of unspecified clavicle, initial encounter for closed fracture: Secondary | ICD-10-CM | POA: Diagnosis present

## 2020-06-01 DIAGNOSIS — S42021A Displaced fracture of shaft of right clavicle, initial encounter for closed fracture: Secondary | ICD-10-CM | POA: Insufficient documentation

## 2020-06-01 DIAGNOSIS — S62326A Displaced fracture of shaft of fifth metacarpal bone, right hand, initial encounter for closed fracture: Secondary | ICD-10-CM | POA: Insufficient documentation

## 2020-06-01 DIAGNOSIS — S42141A Displaced fracture of glenoid cavity of scapula, right shoulder, initial encounter for closed fracture: Secondary | ICD-10-CM | POA: Insufficient documentation

## 2020-06-01 DIAGNOSIS — Z20822 Contact with and (suspected) exposure to covid-19: Secondary | ICD-10-CM | POA: Insufficient documentation

## 2020-06-01 DIAGNOSIS — J939 Pneumothorax, unspecified: Secondary | ICD-10-CM

## 2020-06-01 DIAGNOSIS — K219 Gastro-esophageal reflux disease without esophagitis: Secondary | ICD-10-CM | POA: Insufficient documentation

## 2020-06-01 HISTORY — DX: Gastro-esophageal reflux disease without esophagitis: K21.9

## 2020-06-01 LAB — COMPREHENSIVE METABOLIC PANEL
ALT: 45 U/L — ABNORMAL HIGH (ref 0–44)
AST: 56 U/L — ABNORMAL HIGH (ref 15–41)
Albumin: 4.6 g/dL (ref 3.5–5.0)
Alkaline Phosphatase: 83 U/L (ref 38–126)
Anion gap: 12 (ref 5–15)
BUN: 8 mg/dL (ref 6–20)
CO2: 21 mmol/L — ABNORMAL LOW (ref 22–32)
Calcium: 9.3 mg/dL (ref 8.9–10.3)
Chloride: 105 mmol/L (ref 98–111)
Creatinine, Ser: 1.23 mg/dL (ref 0.61–1.24)
GFR calc Af Amer: >60 mL/min (ref >60)
GFR calc non Af Amer: >60 mL/min (ref >60)
Glucose, Bld: 148 mg/dL — ABNORMAL HIGH (ref 70–99)
Potassium: 3.7 mmol/L (ref 3.5–5.1)
Sodium: 138 mmol/L (ref 135–145)
Total Bilirubin: 0.7 mg/dL (ref 0.3–1.2)
Total Protein: 7.4 g/dL (ref 6.5–8.1)

## 2020-06-01 LAB — CBC WITH DIFFERENTIAL/PLATELET
Abs Immature Granulocytes: 0 10*3/uL (ref 0.00–0.07)
Basophils Absolute: 0.5 10*3/uL — ABNORMAL HIGH (ref 0.0–0.1)
Basophils Relative: 2 %
Eosinophils Absolute: 0.7 10*3/uL — ABNORMAL HIGH (ref 0.0–0.5)
Eosinophils Relative: 3 %
HCT: 47.7 % (ref 39.0–52.0)
Hemoglobin: 15.5 g/dL (ref 13.0–17.0)
Lymphocytes Relative: 17 %
Lymphs Abs: 4.1 10*3/uL — ABNORMAL HIGH (ref 0.7–4.0)
MCH: 28.2 pg (ref 26.0–34.0)
MCHC: 32.5 g/dL (ref 30.0–36.0)
MCV: 86.7 fL (ref 80.0–100.0)
Monocytes Absolute: 1 10*3/uL (ref 0.1–1.0)
Monocytes Relative: 4 %
Neutro Abs: 18 10*3/uL — ABNORMAL HIGH (ref 1.7–7.7)
Neutrophils Relative %: 74 %
Platelets: 251 10*3/uL (ref 150–400)
RBC: 5.5 MIL/uL (ref 4.22–5.81)
RDW: 13.7 % (ref 11.5–15.5)
WBC: 24.3 10*3/uL — ABNORMAL HIGH (ref 4.0–10.5)
nRBC: 0 % (ref 0.0–0.2)
nRBC: 0 /100 WBC

## 2020-06-01 LAB — SARS CORONAVIRUS 2 BY RT PCR (HOSPITAL ORDER, PERFORMED IN ~~LOC~~ HOSPITAL LAB): SARS Coronavirus 2: NEGATIVE

## 2020-06-01 MED ORDER — MORPHINE SULFATE (PF) 4 MG/ML IV SOLN
4.0000 mg | Freq: Once | INTRAVENOUS | Status: AC
  Administered 2020-06-01: 4 mg via INTRAVENOUS
  Filled 2020-06-01: qty 1

## 2020-06-01 MED ORDER — PANTOPRAZOLE SODIUM 40 MG PO TBEC
40.0000 mg | DELAYED_RELEASE_TABLET | Freq: Every day | ORAL | Status: DC
  Administered 2020-06-02: 40 mg via ORAL
  Filled 2020-06-01: qty 1

## 2020-06-01 MED ORDER — ONDANSETRON 4 MG PO TBDP: 4.0000 mg | ORAL_TABLET | Freq: Four times a day (QID) | ORAL | Status: DC | PRN

## 2020-06-01 MED ORDER — KETOROLAC TROMETHAMINE 30 MG/ML IJ SOLN
30.0000 mg | Freq: Three times a day (TID) | INTRAMUSCULAR | Status: DC
  Administered 2020-06-01 – 2020-06-02 (×3): 30 mg via INTRAVENOUS
  Filled 2020-06-01 (×3): qty 1

## 2020-06-01 MED ORDER — ENOXAPARIN SODIUM 40 MG/0.4ML ~~LOC~~ SOLN
40.0000 mg | SUBCUTANEOUS | Status: DC
  Administered 2020-06-02: 40 mg via SUBCUTANEOUS
  Filled 2020-06-01: qty 0.4

## 2020-06-01 MED ORDER — ACETAMINOPHEN 500 MG PO TABS
1000.0000 mg | ORAL_TABLET | Freq: Three times a day (TID) | ORAL | Status: DC
  Administered 2020-06-01 – 2020-06-02 (×2): 1000 mg via ORAL
  Filled 2020-06-01 (×2): qty 2

## 2020-06-01 MED ORDER — MORPHINE SULFATE (PF) 2 MG/ML IV SOLN
1.0000 mg | INTRAVENOUS | Status: DC | PRN
  Administered 2020-06-02: 1 mg via INTRAVENOUS
  Administered 2020-06-02 (×2): 2 mg via INTRAVENOUS
  Filled 2020-06-01 (×3): qty 1

## 2020-06-01 MED ORDER — IOHEXOL 300 MG/ML  SOLN
100.0000 mL | Freq: Once | INTRAMUSCULAR | Status: AC | PRN
  Administered 2020-06-01: 100 mL via INTRAVENOUS

## 2020-06-01 MED ORDER — METHOCARBAMOL 750 MG PO TABS
750.0000 mg | ORAL_TABLET | Freq: Three times a day (TID) | ORAL | Status: DC | PRN
  Administered 2020-06-02: 750 mg via ORAL
  Filled 2020-06-01: qty 1

## 2020-06-01 MED ORDER — ONDANSETRON HCL 4 MG/2ML IJ SOLN
4.0000 mg | Freq: Once | INTRAMUSCULAR | Status: AC
  Administered 2020-06-01: 4 mg via INTRAVENOUS
  Filled 2020-06-01: qty 2

## 2020-06-01 MED ORDER — OXYCODONE HCL 5 MG PO TABS
10.0000 mg | ORAL_TABLET | ORAL | Status: DC | PRN
  Administered 2020-06-02 (×2): 10 mg via ORAL
  Filled 2020-06-01 (×2): qty 2

## 2020-06-01 MED ORDER — ONDANSETRON HCL 4 MG/2ML IJ SOLN: 4.0000 mg | Freq: Four times a day (QID) | INTRAMUSCULAR | Status: DC | PRN

## 2020-06-01 MED ORDER — KCL IN DEXTROSE-NACL 20-5-0.45 MEQ/L-%-% IV SOLN
INTRAVENOUS | Status: DC
  Filled 2020-06-01 (×2): qty 1000

## 2020-06-01 MED ORDER — PANTOPRAZOLE SODIUM 40 MG IV SOLR: 40.0000 mg | Freq: Every day | INTRAVENOUS | Status: DC

## 2020-06-01 MED ORDER — OXYCODONE HCL 5 MG PO TABS
5.0000 mg | ORAL_TABLET | ORAL | Status: DC | PRN
  Filled 2020-06-01: qty 1

## 2020-06-01 MED ORDER — DOCUSATE SODIUM 100 MG PO CAPS
100.0000 mg | ORAL_CAPSULE | Freq: Two times a day (BID) | ORAL | Status: DC
  Administered 2020-06-01 – 2020-06-02 (×2): 100 mg via ORAL
  Filled 2020-06-01 (×2): qty 1

## 2020-06-01 NOTE — ED Notes (Signed)
Wilkie Aye, pt's wife, would like an update on pt in AM. [phone]

## 2020-06-01 NOTE — ED Notes (Signed)
Patient transported to MRI 
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
## 2020-06-01 NOTE — Progress Notes (Signed)
Orthopedic Tech Progress Note Patient Details:  Dustin [name] May 22, 1986 [id number]  Ortho Devices Type of Ortho Device: Ulna gutter splint, Shoulder immobilizer Ortho Device/Splint Location: RUE Ortho Device/Splint Interventions: Application, Adjustment   Post Interventions Patient Tolerated: Well Instructions Provided: Adjustment of device, Care of device   Dustin [name] E Dustin [name] 06/01/2020, 11:49 PM
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
## 2020-06-01 NOTE — ED Notes (Signed)
Patient transported to X-ray 

## 2020-06-02 ENCOUNTER — Observation Stay (HOSPITAL_COMMUNITY): Payer: Self-pay

## 2020-06-02 ENCOUNTER — Encounter (HOSPITAL_COMMUNITY): Payer: Self-pay

## 2020-06-02 DIAGNOSIS — S42009A Fracture of unspecified part of unspecified clavicle, initial encounter for closed fracture: Secondary | ICD-10-CM | POA: Diagnosis present

## 2020-06-02 LAB — COMPREHENSIVE METABOLIC PANEL
ALT: 35 U/L (ref 0–44)
AST: 35 U/L (ref 15–41)
Albumin: 4 g/dL (ref 3.5–5.0)
Alkaline Phosphatase: 78 U/L (ref 38–126)
Anion gap: 9 (ref 5–15)
BUN: 8 mg/dL (ref 6–20)
CO2: 23 mmol/L (ref 22–32)
Calcium: 9 mg/dL (ref 8.9–10.3)
Chloride: 104 mmol/L (ref 98–111)
Creatinine, Ser: 1.05 mg/dL (ref 0.61–1.24)
GFR calc Af Amer: >60 mL/min (ref >60)
GFR calc non Af Amer: >60 mL/min (ref >60)
Glucose, Bld: 119 mg/dL — ABNORMAL HIGH (ref 70–99)
Potassium: 4 mmol/L (ref 3.5–5.1)
Sodium: 136 mmol/L (ref 135–145)
Total Bilirubin: 1.1 mg/dL (ref 0.3–1.2)
Total Protein: 6.9 g/dL (ref 6.5–8.1)

## 2020-06-02 LAB — CBC
HCT: 43.5 % (ref 39.0–52.0)
Hemoglobin: 14.3 g/dL (ref 13.0–17.0)
MCH: 28.3 pg (ref 26.0–34.0)
MCHC: 32.9 g/dL (ref 30.0–36.0)
MCV: 86.1 fL (ref 80.0–100.0)
Platelets: 195 10*3/uL (ref 150–400)
RBC: 5.05 MIL/uL (ref 4.22–5.81)
RDW: 13.9 % (ref 11.5–15.5)
WBC: 15.6 10*3/uL — ABNORMAL HIGH (ref 4.0–10.5)
nRBC: 0 % (ref 0.0–0.2)

## 2020-06-02 LAB — SURGICAL PCR SCREEN
MRSA, PCR: NEGATIVE
Staphylococcus aureus: NEGATIVE

## 2020-06-02 MED ORDER — IBUPROFEN 200 MG PO TABS
600.0000 mg | ORAL_TABLET | Freq: Three times a day (TID) | ORAL | Status: DC | PRN
Start: 2020-06-02 — End: 2020-06-05

## 2020-06-02 MED ORDER — ENOXAPARIN SODIUM 30 MG/0.3ML ~~LOC~~ SOLN: 30.0000 mg | Freq: Two times a day (BID) | SUBCUTANEOUS | Status: DC

## 2020-06-02 MED ORDER — ACETAMINOPHEN 500 MG PO TABS: 1000.0000 mg | ORAL_TABLET | Freq: Four times a day (QID) | ORAL | 0 refills | Status: DC | PRN

## 2020-06-02 MED ORDER — POLYETHYLENE GLYCOL 3350 17 G PO PACK: 17.0000 g | PACK | Freq: Every day | ORAL | 0 refills | Status: DC | PRN

## 2020-06-02 MED ORDER — OXYCODONE HCL 10 MG PO TABS: 5.0000 mg | ORAL_TABLET | ORAL | 0 refills | Status: AC | PRN

## 2020-06-02 MED ORDER — MUPIROCIN 2 % EX OINT
1.0000 "application " | TOPICAL_OINTMENT | Freq: Two times a day (BID) | CUTANEOUS | Status: DC
  Administered 2020-06-02: 1 via NASAL
  Filled 2020-06-02: qty 22

## 2020-06-02 MED ORDER — ACETAMINOPHEN 500 MG PO TABS
1000.0000 mg | ORAL_TABLET | Freq: Four times a day (QID) | ORAL | Status: DC
  Administered 2020-06-02: 1000 mg via ORAL
  Filled 2020-06-02: qty 2

## 2020-06-02 MED ORDER — METHOCARBAMOL 750 MG PO TABS: 750.0000 mg | ORAL_TABLET | Freq: Three times a day (TID) | ORAL | 0 refills | Status: DC | PRN

## 2020-06-02 MED FILL — oxyCODONE HCL 10 MG TABS: 10 | 5 days supply | Qty: 30 | Fill #0

## 2020-06-02 MED FILL — METHOCARBAMOL 750 MG TABS: 750 | 10 days supply | Qty: 30 | Fill #0
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
## 2020-06-02 NOTE — ED Notes (Signed)
Report given to Joseph RN
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
## 2020-06-02 NOTE — TOC CAGE-AID Note (Signed)
Transition of Care [facility]) - CAGE-AID Screening   Patient Details  Name: Dustin [name] MRN: [id number] Date of Birth: 19-Nov-1986  Transition of [facility]) CM/SW Contact:    Emeterio Reeve, [place] Phone Number: 06/02/2020, 11:17 AM   Clinical Narrative:  CSW met with pt at bedside. CSW introduced self and explained her role at the hospital. Pt reported having a 1-2 of beers, a couple times a week. Pt reports daily THC use. Pt stated he does not have a problem with alcohol use or thc use. Pt states he has no intention on stopping thc use. Pt was receptive to counseling and educational resources. Pt denied treatment resources.   CAGE-AID Screening:    Have You Ever Felt You Ought to Cut Down on Your Drinking or Drug Use?: No Have People Annoyed You By Critizing Your Drinking Or Drug Use?: No Have You Felt Bad Or Guilty About Your Drinking Or Drug Use?: No Have You Ever Had a Drink or Used Drugs First Thing In The Morning to STeady Your Nerves or to Get Rid of a Hangover?: No CAGE-AID Score: 0  Substance Abuse Education Offered: Yes  Substance abuse interventions: Patient Counseling, Educational Materials  Emeterio Reeve, Latanya Presser, [place] Social Worker [phone]

## 2020-06-02 NOTE — ED Notes (Signed)
Attempted report 
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
## 2020-06-02 NOTE — Discharge Instructions (Addendum)
For scapular and clavicle fractures: Sling to be worn for 2 weeks, then he can begin pendulums and scapular retractions.  Nonweightbearing x6 weeks  For right hand fracture: Call to arrange follow up with Dr. Melvyn Novas to schedule surgery  Scapular Fracture  A scapular fracture is a break in the large, triangular bone behind your shoulder (shoulder blade or scapula). This bone makes up the socket joint of your shoulder. The scapula is well protected by muscles, so scapular fractures are unusual injuries. They often involve a lot of force. People who have a scapular fracture often have other injuries as well. These may be injuries to the lung, spine, head, shoulder, or ribs. What are the causes? Common causes of this condition include:  A fall from a great height.  A car or motorcycle accident.  A heavy, direct blow to the scapula. What are the signs or symptoms? The main symptom of a scapular fracture is severe pain when you try to move your arm. Other signs and symptoms include:  Swelling behind the shoulder.  Bruising.  Holding the arm still and close to the body. How is this diagnosed? This condition may be diagnosed based on:  Your symptoms and the details of a recent injury.  A physical exam.  X-ray or CT scan to confirm the diagnosis and to check for other injuries. How is this treated? This condition may be treated with:  Immobilization. Your arm is put in a sling. A support bandage may be wrapped around your chest. The health care provider will explain how to move your shoulder for the first week after your injury in order to prevent pain and stiffness. The sling can be removed as your movement increases and your pain decreases.  Physical therapy. A physical therapist will teach you exercises to stretch and strengthen your shoulder. The goal is to keep your shoulder from getting stiff or frozen. You may need to do these exercises for 6-12 months.  Surgery. You may need  surgery if the bone pieces are out of place (displaced fracture). You may also need surgery if the fracture causes the bone to be deformed. In this case, the broken scapula will be put back into position and held in place with a surgical plate and screws. Surgery is rarely done for this condition. Follow these instructions at home:  Medicines  Take over-the-counter and prescription medicines only as told by your health care provider.  Do not drive or use heavy machinery while taking prescription pain medicine. If you have a splint and a wrap:  Wear the splint and the wrap as told by your health care provider. Remove them only as told by your health care provider.  Loosen them if your fingers or toes tingle, become numb, or turn cold and blue.  Keep them clean.  If they are not waterproof: ? Do not let them get wet. ? Cover them with a watertight covering when you take a bath or a shower. Managing pain, stiffness, and swelling  Apply ice to the back of your shoulder: ? If you have a removable splint or wrap, remove it as told by your health care provider. ? Put ice in a plastic bag. ? Place a towel between your skin and the bag. ? Leave the ice on for 20 minutes, 2-3 times per day.  Do not lift anything that is heavier than 10 lbs. (4.5 kg), or the limit that your health care provider tells you, until he or she says that  it is safe.  Avoid activities that make your symptoms worse for 4-6 weeks, or as long as directed. General instructions  Ask your health care provider when it is safe for you to drive.  Do not use any products that contain nicotine or tobacco, such as cigarettes and e-cigarettes. These can delay bone healing. If you need help quitting, ask your health care provider.  Drink enough fluid to keep your urine pale yellow.  Do physical therapy exercises as told by your health care provider.  Return to your normal activities as told by your health care provider. Ask  your health care provider what activities are safe for you.  Keep all follow-up visits as told by your health care provider. This is important. Contact a health care provider if:  You have pain that is not relieved by medicine.  You are unable to do your physical therapy because of pain or stiffness. Get help right away if:  You are short of breath.  You cough up blood.  You cannot move your arm or your fingers. Summary  A scapular fracture is a break in the large, triangular bone behind your shoulder (shoulder blade or scapula).  The scapula is well protected by muscles, so scapular fractures are unusual injuries. They often involve a lot of force.  The main symptom of a scapular fracture is severe pain when you try to move your arm.  Immobilization, physical therapy, and surgery are used to treat this injury. Surgery is rarely done.  Follow your health care provider's instructions on taking medicines, using a wrap and splint, putting ice on the injured area, and resting from regular activities. This information is not intended to replace advice given to you by your health care provider. Make sure you discuss any questions you have with your health care provider. Document Revised: 01/30/2018 Document Reviewed: 12/30/2017 [employer] Patient Education  2020 [employer]   Rib Fracture  A rib fracture is a break or crack in one of the bones of the ribs. The ribs are like a cage that goes around your upper chest. A broken or cracked rib is often painful, but most do not cause other problems. Most rib fractures usually heal on their own in 1-3 months. Follow these instructions at home: Managing pain, stiffness, and swelling  If directed, apply ice to the injured area. ? Put ice in a plastic bag. ? Place a towel between your skin and the bag. ? Leave the ice on for 20 minutes, 2-3 times a day.  Take over-the-counter and prescription medicines only as told by your  doctor. Activity  Avoid activities that cause pain to the injured area. Protect your injured area.  Slowly increase activity as told by your doctor. General instructions  Do deep breathing as told by your doctor. You may be told to: ? Take deep breaths many times a day. ? Cough many times a day while hugging a pillow. ? Use a device (incentive spirometer) to do deep breathing many times a day.  Drink enough fluid to keep your pee (urine) clear or pale yellow.  Do not wear a rib belt or binder. These do not allow you to breathe deeply.  Keep all follow-up visits as told by your doctor. This is important. Contact a doctor if:  You have a fever. Get help right away if:  You have trouble breathing.  You are short of breath.  You cannot stop coughing.  You cough up thick or bloody spit (sputum).  You feel sick to your stomach (nauseous), throw up (vomit), or have belly (abdominal) pain.  Your pain gets worse and medicine does not help. Summary  A rib fracture is a break or crack in one of the bones of the ribs.  Apply ice to the injured area and take medicines for pain as told by your doctor.  Take deep breaths and cough many times a day. Hug a pillow every time you cough. This information is not intended to replace advice given to you by your health care provider. Make sure you discuss any questions you have with your health care provider. Document Revised: 10/31/2017 Document Reviewed: 02/18/2017 [employer] Patient Education  2020 [employer].
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
## 2020-06-02 NOTE — Plan of Care (Signed)
  Problem: Education: Goal: Knowledge of General Education information will improve Description: Including pain rating scale, medication(s)/side effects and non-pharmacologic comfort measures 06/02/2020 1456 by Coralyn Pear, RN Outcome: Adequate for Discharge 06/02/2020 1121 by Coralyn Pear, RN Outcome: Progressing   Problem: Health Behavior/Discharge Planning: Goal: Ability to manage health-related needs will improve Outcome: Adequate for Discharge   Problem: Clinical Measurements: Goal: Ability to maintain clinical measurements within normal limits will improve Outcome: Adequate for Discharge Goal: Will remain free from infection Outcome: Adequate for Discharge Goal: Diagnostic test results will improve Outcome: Adequate for Discharge Goal: Respiratory complications will improve Outcome: Adequate for Discharge Goal: Cardiovascular complication will be avoided Outcome: Adequate for Discharge   Problem: Activity: Goal: Risk for activity intolerance will decrease 06/02/2020 1456 by Coralyn Pear, RN Outcome: Adequate for Discharge 06/02/2020 1121 by Coralyn Pear, RN Outcome: Progressing   Problem: Nutrition: Goal: Adequate nutrition will be maintained 06/02/2020 1456 by Coralyn Pear, RN Outcome: Adequate for Discharge 06/02/2020 1121 by Coralyn Pear, RN Outcome: Progressing   Problem: Coping: Goal: Level of anxiety will decrease Outcome: Adequate for Discharge   Problem: Elimination: Goal: Will not experience complications related to bowel motility Outcome: Adequate for Discharge Goal: Will not experience complications related to urinary retention Outcome: Adequate for Discharge   Problem: Pain Managment: Goal: General experience of comfort will improve Outcome: Adequate for Discharge   Problem: Safety: Goal: Ability to remain free from injury will improve Outcome: Adequate for Discharge   Problem: Skin Integrity: Goal: Risk for impaired skin integrity will  decrease Outcome: Adequate for Discharge
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
## 2020-06-02 NOTE — ED Notes (Signed)
MS Breakfast Ordered 

## 2020-06-02 NOTE — Plan of Care (Signed)
  Problem: Education: Goal: Knowledge of General Education information will improve Description: Including pain rating scale, medication(s)/side effects and non-pharmacologic comfort measures Outcome: Progressing  Aeb pt verbalzies poc this AM Problem: Activity: Goal: Risk for activity intolerance will decrease Outcome: Progressing  Aeb pt ambulating in room this AM Problem: Nutrition: Goal: Adequate nutrition will be maintained Outcome: Progressing  Aeb pt eating breakfast this AM

## 2020-06-03 ENCOUNTER — Encounter (HOSPITAL_COMMUNITY): Payer: Self-pay | Admitting: Orthopedic Surgery

## 2020-06-03 NOTE — Progress Notes (Signed)
Patient denies chest pain, shortness of breath, or cardiac test. States he has been in quarantine since leaving hospital. Notified to call OR at 1500 tomorrow if no one has called with an arrival time and to come to ED for check-in. Educated on Museum/gallery conservator.
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
## 2020-06-05 ENCOUNTER — Inpatient Hospital Stay (HOSPITAL_COMMUNITY): Payer: Self-pay | Admitting: Certified Registered"

## 2020-06-05 ENCOUNTER — Encounter (HOSPITAL_COMMUNITY): Payer: Self-pay | Admitting: Orthopedic Surgery

## 2020-06-05 ENCOUNTER — Other Ambulatory Visit: Payer: Self-pay

## 2020-06-05 ENCOUNTER — Encounter (HOSPITAL_COMMUNITY)
Disposition: A | Discharge status: Home / Self Care | Payer: Self-pay | Source: Home / Self Care | Attending: Orthopedic Surgery

## 2020-06-05 ENCOUNTER — Ambulatory Visit (HOSPITAL_COMMUNITY)
Admit: 2020-06-05 | Discharge: 2020-06-05 | Disposition: A | Discharge status: Home / Self Care | Payer: Self-pay | Attending: Orthopedic Surgery | Admitting: Orthopedic Surgery

## 2020-06-05 ENCOUNTER — Inpatient Hospital Stay (HOSPITAL_COMMUNITY): Payer: Self-pay

## 2020-06-05 DIAGNOSIS — S62326A Displaced fracture of shaft of fifth metacarpal bone, right hand, initial encounter for closed fracture: Secondary | ICD-10-CM | POA: Insufficient documentation

## 2020-06-05 DIAGNOSIS — F17210 Nicotine dependence, cigarettes, uncomplicated: Secondary | ICD-10-CM | POA: Insufficient documentation

## 2020-06-05 HISTORY — PX: CLOSED REDUCTION METACARPAL WITH PERCUTANEOUS PINNING: SHX5613

## 2020-06-05 SURGERY — CLOSED REDUCTION, FRACTURE, METACARPAL BONE, WITH PERCUTANEOUS PINNING
Anesthesia: General | Site: Finger | Laterality: Right

## 2020-06-05 MED ORDER — FENTANYL CITRATE (PF) 100 MCG/2ML IJ SOLN
25.0000 ug | INTRAMUSCULAR | Status: DC | PRN
  Administered 2020-06-05: 50 ug via INTRAVENOUS

## 2020-06-05 MED ORDER — OXYCODONE HCL 5 MG/5ML PO SOLN: 5.0000 mg | Freq: Once | ORAL | Status: AC | PRN

## 2020-06-05 MED ORDER — FENTANYL CITRATE (PF) 100 MCG/2ML IJ SOLN
INTRAMUSCULAR | Status: AC
  Filled 2020-06-05: qty 2

## 2020-06-05 MED ORDER — HYDROMORPHONE HCL 1 MG/ML IJ SOLN
INTRAMUSCULAR | Status: DC | PRN
  Administered 2020-06-05 (×2): .5 mg via INTRAVENOUS

## 2020-06-05 MED ORDER — ONDANSETRON HCL 4 MG/2ML IJ SOLN
INTRAMUSCULAR | Status: DC | PRN
  Administered 2020-06-05: 4 mg via INTRAVENOUS

## 2020-06-05 MED ORDER — DEXMEDETOMIDINE HCL 200 MCG/2ML IV SOLN
INTRAVENOUS | Status: DC | PRN
  Administered 2020-06-05: 12 ug via INTRAVENOUS
  Administered 2020-06-05: 8 ug via INTRAVENOUS
  Administered 2020-06-05: 12 ug via INTRAVENOUS
  Administered 2020-06-05: 8 ug via INTRAVENOUS

## 2020-06-05 MED ORDER — AMISULPRIDE (ANTIEMETIC) 5 MG/2ML IV SOLN: 10.0000 mg | Freq: Once | INTRAVENOUS | Status: DC | PRN

## 2020-06-05 MED ORDER — PROPOFOL 10 MG/ML IV BOLUS
INTRAVENOUS | Status: AC
  Filled 2020-06-05: qty 20

## 2020-06-05 MED ORDER — DEXAMETHASONE SODIUM PHOSPHATE 10 MG/ML IJ SOLN
INTRAMUSCULAR | Status: DC | PRN
  Administered 2020-06-05: 10 mg via INTRAVENOUS

## 2020-06-05 MED ORDER — HYDROMORPHONE HCL 1 MG/ML IJ SOLN
INTRAMUSCULAR | Status: AC
  Filled 2020-06-05: qty 0.5

## 2020-06-05 MED ORDER — CEFAZOLIN SODIUM-DEXTROSE 2-4 GM/100ML-% IV SOLN: 2.0000 g | INTRAVENOUS | Status: DC

## 2020-06-05 MED ORDER — FENTANYL CITRATE (PF) 100 MCG/2ML IJ SOLN
INTRAMUSCULAR | Status: DC | PRN
  Administered 2020-06-05 (×2): 50 ug via INTRAVENOUS
  Administered 2020-06-05: 100 ug via INTRAVENOUS
  Administered 2020-06-05: 50 ug via INTRAVENOUS

## 2020-06-05 MED ORDER — LACTATED RINGERS IV SOLN: INTRAVENOUS | Status: DC | PRN

## 2020-06-05 MED ORDER — DIPHENHYDRAMINE HCL 50 MG/ML IJ SOLN
INTRAMUSCULAR | Status: DC | PRN
Start: 2020-06-05 — End: 2020-06-05
  Administered 2020-06-05: 12.5 mg via INTRAVENOUS

## 2020-06-05 MED ORDER — LIDOCAINE 2% (20 MG/ML) 5 ML SYRINGE
INTRAMUSCULAR | Status: DC | PRN
  Administered 2020-06-05: 60 mg via INTRAVENOUS

## 2020-06-05 MED ORDER — OXYCODONE HCL 5 MG PO TABS
ORAL_TABLET | ORAL | Status: AC
  Filled 2020-06-05: qty 1

## 2020-06-05 MED ORDER — CEFAZOLIN SODIUM-DEXTROSE 2-3 GM-%(50ML) IV SOLR
INTRAVENOUS | Status: DC | PRN
  Administered 2020-06-05: 2 g via INTRAVENOUS

## 2020-06-05 MED ORDER — POVIDONE-IODINE 7.5 % EX SOLN
Freq: Once | CUTANEOUS | Status: DC
  Filled 2020-06-05: qty 118

## 2020-06-05 MED ORDER — FENTANYL CITRATE (PF) 250 MCG/5ML IJ SOLN
INTRAMUSCULAR | Status: AC
  Filled 2020-06-05: qty 5

## 2020-06-05 MED ORDER — ACETAMINOPHEN 10 MG/ML IV SOLN: 1000.0000 mg | Freq: Once | INTRAVENOUS | Status: DC | PRN

## 2020-06-05 MED ORDER — PROPOFOL 10 MG/ML IV BOLUS
INTRAVENOUS | Status: DC | PRN
  Administered 2020-06-05: 200 mg via INTRAVENOUS
  Administered 2020-06-05: 30 mg via INTRAVENOUS

## 2020-06-05 MED ORDER — OXYCODONE HCL 10 MG PO TABS: 10.0000 mg | ORAL_TABLET | Freq: Four times a day (QID) | ORAL | 0 refills | Status: AC

## 2020-06-05 MED ORDER — 0.9 % SODIUM CHLORIDE (POUR BTL) OPTIME
TOPICAL | Status: DC | PRN
  Administered 2020-06-05: 1000 mL

## 2020-06-05 MED ORDER — MIDAZOLAM HCL 2 MG/2ML IJ SOLN
INTRAMUSCULAR | Status: AC
  Filled 2020-06-05: qty 2

## 2020-06-05 MED ORDER — MIDAZOLAM HCL 5 MG/5ML IJ SOLN
INTRAMUSCULAR | Status: DC | PRN
  Administered 2020-06-05: 2 mg via INTRAVENOUS

## 2020-06-05 MED ORDER — OXYCODONE HCL 5 MG PO TABS
5.0000 mg | ORAL_TABLET | Freq: Once | ORAL | Status: AC | PRN
  Administered 2020-06-05: 5 mg via ORAL

## 2020-06-05 SURGICAL SUPPLY — 36 items
BENZOIN TINCTURE PRP APPL 2/3 (GAUZE/BANDAGES/DRESSINGS) IMPLANT
BLADE CLIPPER SURG (BLADE) IMPLANT
BNDG ELASTIC 3X5.8 VLCR STR LF (GAUZE/BANDAGES/DRESSINGS) ×3 IMPLANT
BNDG ELASTIC 4X5.8 VLCR STR LF (GAUZE/BANDAGES/DRESSINGS) ×3 IMPLANT
BNDG GAUZE ELAST 4 BULKY (GAUZE/BANDAGES/DRESSINGS) ×3 IMPLANT
CLOSURE WOUND 1/2 X4 (GAUZE/BANDAGES/DRESSINGS)
COVER SURGICAL LIGHT HANDLE (MISCELLANEOUS) ×3 IMPLANT
CUFF TOURN SGL QUICK 18X4 (TOURNIQUET CUFF) ×3 IMPLANT
CUFF TOURN SGL QUICK 24 (TOURNIQUET CUFF)
CUFF TRNQT CYL 24X4X16.5-23 (TOURNIQUET CUFF) IMPLANT
DRAPE OEC MINIVIEW 54X84 (DRAPES) ×3 IMPLANT
DRSG ADAPTIC 3X8 NADH LF (GAUZE/BANDAGES/DRESSINGS) ×3 IMPLANT
DRSG EMULSION OIL 3X3 NADH (GAUZE/BANDAGES/DRESSINGS) ×3 IMPLANT
GAUZE SPONGE 4X4 12PLY STRL (GAUZE/BANDAGES/DRESSINGS) ×3 IMPLANT
GAUZE SPONGE 4X4 12PLY STRL LF (GAUZE/BANDAGES/DRESSINGS) ×3 IMPLANT
GAUZE XEROFORM 1X8 LF (GAUZE/BANDAGES/DRESSINGS) IMPLANT
GLOVE BIOGEL PI IND STRL 8.5 (GLOVE) ×1 IMPLANT
GLOVE BIOGEL PI INDICATOR 8.5 (GLOVE) ×2
GLOVE SURG ORTHO 8.0 STRL STRW (GLOVE) ×3 IMPLANT
GOWN STRL REUS W/ TWL LRG LVL3 (GOWN DISPOSABLE) ×2 IMPLANT
GOWN STRL REUS W/TWL LRG LVL3 (GOWN DISPOSABLE) ×4
K-WIRE DBL TROCAR .045X4 (WIRE) ×6
KIT BASIN OR (CUSTOM PROCEDURE TRAY) ×6 IMPLANT
KIT TURNOVER KIT B (KITS) ×3 IMPLANT
KWIRE DBL TROCAR .045X4 (WIRE) ×2 IMPLANT
NS IRRIG 1000ML POUR BTL (IV SOLUTION) ×3 IMPLANT
PACK ORTHO EXTREMITY (CUSTOM PROCEDURE TRAY) ×6 IMPLANT
PAD ARMBOARD 7.5X6 YLW CONV (MISCELLANEOUS) ×6 IMPLANT
SOAP 2 % CHG 4 OZ (WOUND CARE) ×3 IMPLANT
STRIP CLOSURE SKIN 1/2X4 (GAUZE/BANDAGES/DRESSINGS) IMPLANT
SUT ETHILON 4 0 P 3 18 (SUTURE) IMPLANT
SUT ETHILON 5 0 P 3 18 (SUTURE)
SUT NYLON ETHILON 5-0 P-3 1X18 (SUTURE) IMPLANT
SUT PROLENE 4 0 P 3 18 (SUTURE) IMPLANT
TOWEL GREEN STERILE (TOWEL DISPOSABLE) ×3 IMPLANT
TOWEL GREEN STERILE FF (TOWEL DISPOSABLE) ×3 IMPLANT

## 2020-06-05 NOTE — Discharge Instructions (Signed)
KEEP BANDAGE CLEAN AND DRY °CALL OFFICE FOR F/U APPT [phone] IN 2 WEEKS °KEEP HAND ELEVATED ABOVE HEART °OK TO APPLY ICE TO OPERATIVE AREA °CONTACT OFFICE IF ANY WORSENING PAIN OR CONCERNS. °

## 2020-06-05 NOTE — Anesthesia Preprocedure Evaluation (Signed)
Anesthesia Evaluation  Patient identified by MRN, date of birth, ID band Patient awake    Reviewed: Allergy & Precautions, NPO status , Patient's Chart, lab work & pertinent test results  Airway Mallampati: II  TM Distance: >3 FB Neck ROM: Full    Dental  (+) Dental Advisory Given   Pulmonary Current Smoker,    breath sounds clear to auscultation       Cardiovascular negative cardio ROS   Rhythm:Regular Rate:Normal     Neuro/Psych negative neurological ROS     GI/Hepatic Neg liver ROS, GERD  ,  Endo/Other  negative endocrine ROS  Renal/GU negative Renal ROS     Musculoskeletal   Abdominal   Peds  Hematology negative hematology ROS (+)   Anesthesia Other Findings   Reproductive/Obstetrics                             Anesthesia Physical Anesthesia Plan  ASA: II  Anesthesia Plan: General   Post-op Pain Management:    Induction: Intravenous  PONV Risk Score and Plan: 1 and Dexamethasone, Ondansetron and Treatment may vary due to age or medical condition  Airway Management Planned: LMA  Additional Equipment:   Intra-op Plan:   Post-operative Plan: Extubation in OR  Informed Consent: I have reviewed the patients History and Physical, chart, labs and discussed the procedure including the risks, benefits and alternatives for the proposed anesthesia with the patient or authorized representative who has indicated his/her understanding and acceptance.     Dental advisory given  Plan Discussed with: CRNA  Anesthesia Plan Comments:         Anesthesia Quick Evaluation
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
## 2020-06-05 NOTE — Transfer of Care (Signed)
Immediate Anesthesia Transfer of Care Note  Patient: Dustin [name]  Procedure(s) Performed: CLOSED REDUCTION METACARPAL WITH PERCUTANEOUS PINNING (Right Finger)  Patient Location: PACU  Anesthesia Type:General  Level of Consciousness: awake, alert  and oriented  Airway & Oxygen Therapy: Patient Spontanous Breathing and Patient connected to face mask oxygen  Post-op Assessment: Report given to RN and Post -op Vital signs reviewed and stable  Post vital signs: Reviewed and stable  Last Vitals:  Vitals Value Taken Time  BP 131/80   Temp 98.4   Pulse 93 06/05/20 0920  Resp 18   SpO2 97 % 06/05/20 0920  Vitals shown include unvalidated device data.  Last Pain:  5/10, PACU RN to give pain medication       Complications: No complications documented.

## 2020-06-05 NOTE — Anesthesia Procedure Notes (Signed)
Procedure Name: LMA Insertion Date/Time: 06/05/2020 8:31 AM Performed by: Sheppard Evens, CRNA Pre-anesthesia Checklist: Patient identified, Emergency Drugs available, Suction available and Patient being monitored Patient Re-evaluated:Patient Re-evaluated prior to induction Oxygen Delivery Method: Circle System Utilized Preoxygenation: Pre-oxygenation with 100% oxygen Induction Type: IV induction Ventilation: Mask ventilation without difficulty LMA: LMA inserted LMA Size: 4.0 Number of attempts: 1 Airway Equipment and Method: Bite block Placement Confirmation: positive ETCO2 Tube secured with: Tape Dental Injury: Teeth and Oropharynx as per pre-operative assessment

## 2020-06-05 NOTE — Anesthesia Postprocedure Evaluation (Signed)
Anesthesia Post Note  Patient: Dustin [name]  Procedure(s) Performed: CLOSED REDUCTION METACARPAL WITH PERCUTANEOUS PINNING (Right Finger)     Patient location during evaluation: PACU Anesthesia Type: General Level of consciousness: awake and alert Pain management: pain level controlled Vital Signs Assessment: post-procedure vital signs reviewed and stable Respiratory status: spontaneous breathing, nonlabored ventilation, respiratory function stable and patient connected to nasal cannula oxygen Cardiovascular status: blood pressure returned to baseline and stable Postop Assessment: no apparent nausea or vomiting Anesthetic complications: no   No complications documented.  Last Vitals:  Vitals:   06/05/20 0951 06/05/20 1007  BP: 134/81 130/80  Pulse: 80 80  Resp: 18 17  Temp: 36.9 C   SpO2: 92% 93%    Last Pain:  Vitals:   06/05/20 0951  TempSrc:   PainSc: 3                  Kennieth Rad

## 2020-06-06 ENCOUNTER — Encounter (HOSPITAL_COMMUNITY): Payer: Self-pay | Admitting: Orthopedic Surgery
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
## 2022-02-18 IMAGING — DX DG SHOULDER 2+V*R*
3 series · 3 of 3 positions shown · non-contrast
Comparison: None.

CLINICAL DATA: Chest pain

EXAM:
RIGHT SHOULDER - 2+ VIEW

[shoulder grashey]
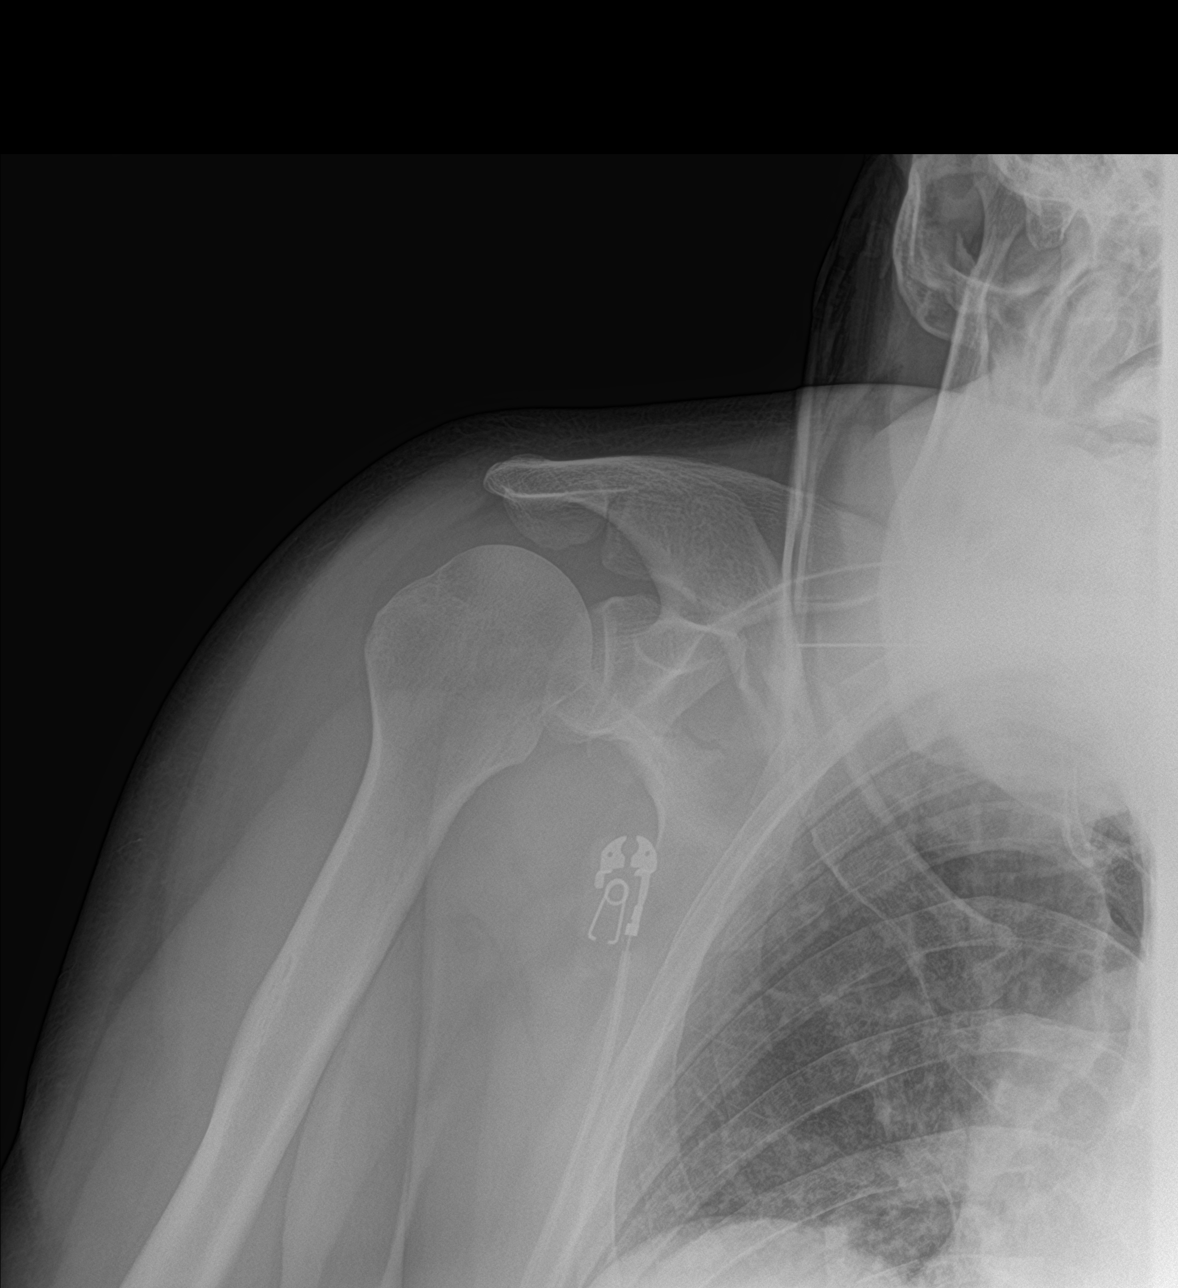

[shoulder y view]
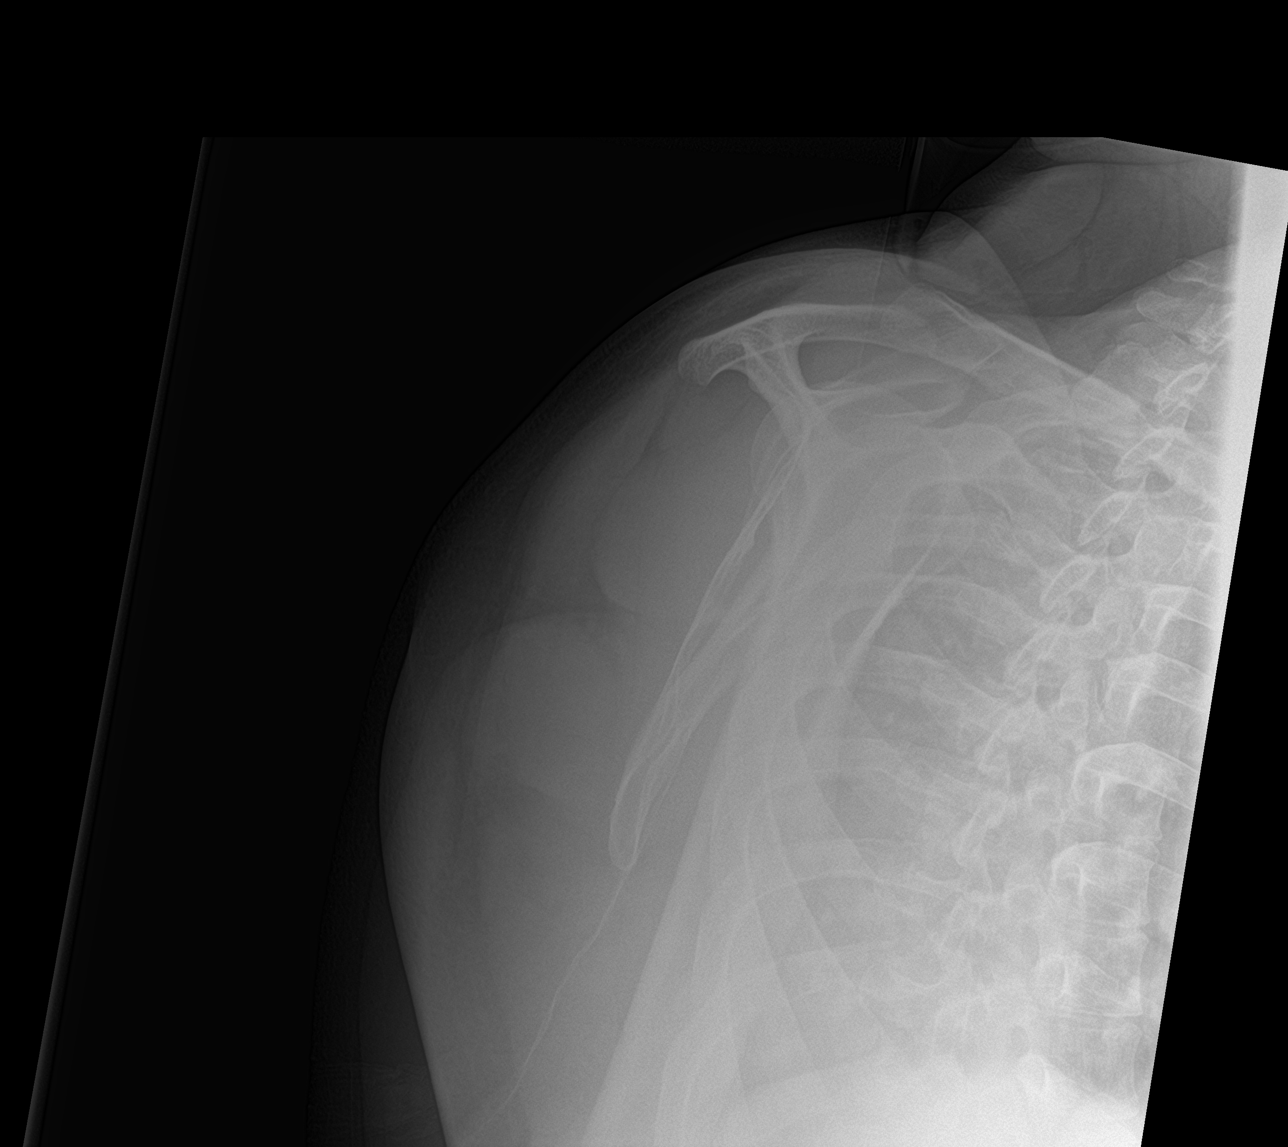

[shoulder ap neutral]
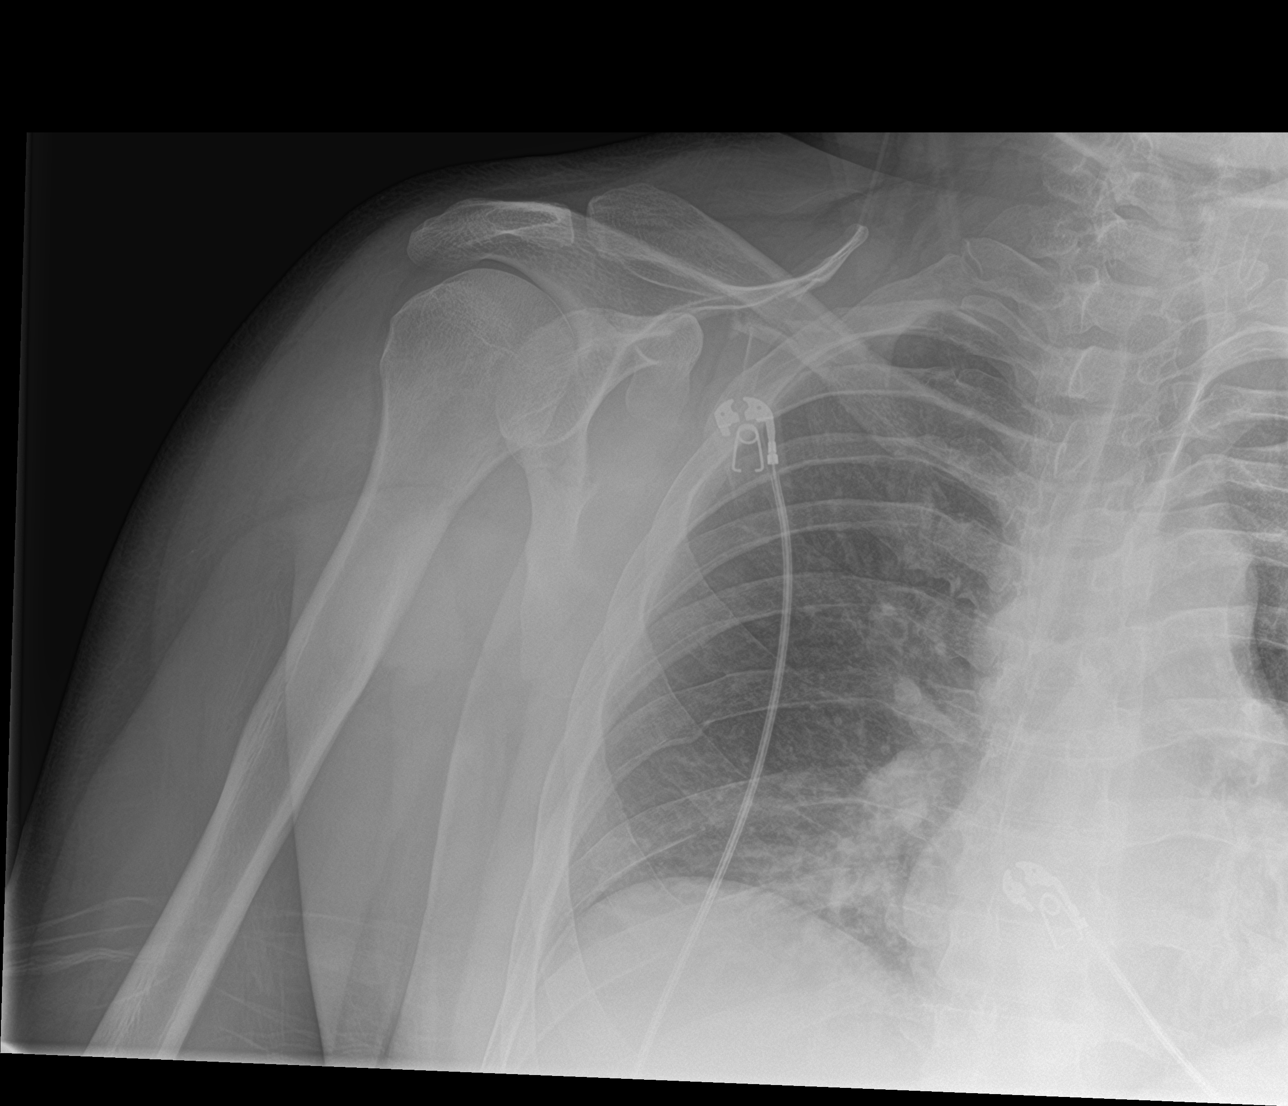

[3 of 3 positions shown; findings below may reference images not displayed]

FINDINGS: Right mid clavicular fracture. Suspected fracture of the scapula
transversely just below the glenoid. Glenohumeral alignment appears
grossly normal. AC joint alignment appears normal.

Old healed right rib fractures.
IMPRESSION: 1. Right midclavicular fracture
2. Suspected fracture of the scapula transversely just below the
glenoid.
3. Old healed right rib fractures.

## 2023-05-13 ENCOUNTER — Encounter (HOSPITAL_BASED_OUTPATIENT_CLINIC_OR_DEPARTMENT_OTHER): Payer: Self-pay | Admitting: Emergency Medicine

## 2023-05-13 ENCOUNTER — Other Ambulatory Visit: Payer: Self-pay

## 2023-05-13 ENCOUNTER — Emergency Department (HOSPITAL_BASED_OUTPATIENT_CLINIC_OR_DEPARTMENT_OTHER): Payer: No Typology Code available for payment source

## 2023-05-13 ENCOUNTER — Emergency Department (HOSPITAL_BASED_OUTPATIENT_CLINIC_OR_DEPARTMENT_OTHER)
Admission: EM | Admit: 2023-05-13 | Discharge: 2023-05-13 | Disposition: A | Discharge status: Home / Self Care | Payer: No Typology Code available for payment source | Attending: Emergency Medicine | Admitting: Emergency Medicine

## 2023-05-13 DIAGNOSIS — S20211A Contusion of right front wall of thorax, initial encounter: Secondary | ICD-10-CM | POA: Insufficient documentation

## 2023-05-13 DIAGNOSIS — S20301A Unspecified superficial injuries of right front wall of thorax, initial encounter: Secondary | ICD-10-CM | POA: Diagnosis present

## 2023-05-13 DIAGNOSIS — S30811A Abrasion of abdominal wall, initial encounter: Secondary | ICD-10-CM | POA: Diagnosis not present

## 2023-05-13 DIAGNOSIS — R109 Unspecified abdominal pain: Secondary | ICD-10-CM | POA: Insufficient documentation

## 2023-05-13 DIAGNOSIS — Y92410 Unspecified street and highway as the place of occurrence of the external cause: Secondary | ICD-10-CM | POA: Diagnosis not present

## 2023-05-13 DIAGNOSIS — R9431 Abnormal electrocardiogram [ECG] [EKG]: Secondary | ICD-10-CM | POA: Diagnosis not present

## 2023-05-13 DIAGNOSIS — S43401A Unspecified sprain of right shoulder joint, initial encounter: Secondary | ICD-10-CM | POA: Insufficient documentation

## 2023-05-13 DIAGNOSIS — S40011A Contusion of right shoulder, initial encounter: Secondary | ICD-10-CM | POA: Diagnosis not present

## 2023-05-13 DIAGNOSIS — S0990XA Unspecified injury of head, initial encounter: Secondary | ICD-10-CM | POA: Diagnosis not present

## 2023-05-13 LAB — CBC WITH DIFFERENTIAL/PLATELET
Abs Immature Granulocytes: 0.05 10*3/uL (ref 0.00–0.07)
Basophils Absolute: 0.1 10*3/uL (ref 0.0–0.1)
Basophils Relative: 1 %
Eosinophils Absolute: 0.2 10*3/uL (ref 0.0–0.5)
Eosinophils Relative: 2 %
HCT: 43.1 % (ref 39.0–52.0)
Hemoglobin: 14.4 g/dL (ref 13.0–17.0)
Immature Granulocytes: 1 %
Lymphocytes Relative: 20 %
Lymphs Abs: 1.8 10*3/uL (ref 0.7–4.0)
MCH: 28.6 pg (ref 26.0–34.0)
MCHC: 33.4 g/dL (ref 30.0–36.0)
MCV: 85.7 fL (ref 80.0–100.0)
Monocytes Absolute: 0.6 10*3/uL (ref 0.1–1.0)
Monocytes Relative: 7 %
Neutro Abs: 6.1 10*3/uL (ref 1.7–7.7)
Neutrophils Relative %: 69 %
Platelets: 205 10*3/uL (ref 150–400)
RBC: 5.03 MIL/uL (ref 4.22–5.81)
RDW: 13.7 % (ref 11.5–15.5)
WBC: 8.8 10*3/uL (ref 4.0–10.5)
nRBC: 0 % (ref 0.0–0.2)

## 2023-05-13 LAB — BASIC METABOLIC PANEL
Anion gap: 9 (ref 5–15)
BUN: <5 mg/dL — ABNORMAL LOW (ref 6–20)
CO2: 24 mmol/L (ref 22–32)
Calcium: 8.8 mg/dL — ABNORMAL LOW (ref 8.9–10.3)
Chloride: 104 mmol/L (ref 98–111)
Creatinine, Ser: 0.81 mg/dL (ref 0.61–1.24)
GFR, Estimated: >60 mL/min (ref >60)
Glucose, Bld: 103 mg/dL — ABNORMAL HIGH (ref 70–99)
Potassium: 3.8 mmol/L (ref 3.5–5.1)
Sodium: 137 mmol/L (ref 135–145)

## 2023-05-13 LAB — TROPONIN I (HIGH SENSITIVITY): Troponin I (High Sensitivity): <2 ng/L (ref <18)

## 2023-05-13 MED ORDER — SODIUM CHLORIDE 0.9 % IV BOLUS
1000.0000 mL | Freq: Once | INTRAVENOUS | Status: AC
  Administered 2023-05-13: 1000 mL via INTRAVENOUS

## 2023-05-13 MED ORDER — OXYCODONE HCL 5 MG PO TABS: 5.0000 mg | ORAL_TABLET | Freq: Four times a day (QID) | ORAL | 0 refills | Status: AC | PRN

## 2023-05-13 MED ORDER — CYCLOBENZAPRINE HCL 10 MG PO TABS: 10.0000 mg | ORAL_TABLET | Freq: Two times a day (BID) | ORAL | 0 refills | Status: AC | PRN

## 2023-05-13 MED ORDER — IOHEXOL 300 MG/ML  SOLN
100.0000 mL | Freq: Once | INTRAMUSCULAR | Status: AC | PRN
  Administered 2023-05-13: 100 mL via INTRAVENOUS

## 2023-05-13 MED ORDER — MORPHINE SULFATE (PF) 4 MG/ML IV SOLN
4.0000 mg | Freq: Once | INTRAVENOUS | Status: AC
  Administered 2023-05-13: 4 mg via INTRAVENOUS
  Filled 2023-05-13: qty 1

## 2023-05-13 MED ORDER — ONDANSETRON HCL 4 MG/2ML IJ SOLN
4.0000 mg | Freq: Once | INTRAMUSCULAR | Status: AC
  Administered 2023-05-13: 4 mg via INTRAVENOUS
  Filled 2023-05-13: qty 2

## 2023-05-13 NOTE — ED Notes (Signed)
A few minutes after placing IV, patient became lightheaded, diaphoretic, HR dropped to 40's and patient had a near syncopal episode. EDP was brought to bedside to evaluate.
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
## 2023-05-13 NOTE — ED Notes (Signed)
With breath hold patient did brady
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
## 2023-05-20 DIAGNOSIS — Z6832 Body mass index (BMI) 32.0-32.9, adult: Secondary | ICD-10-CM | POA: Diagnosis not present

## 2023-05-20 DIAGNOSIS — M722 Plantar fascial fibromatosis: Secondary | ICD-10-CM | POA: Diagnosis not present

## 2023-06-19 DIAGNOSIS — Z6832 Body mass index (BMI) 32.0-32.9, adult: Secondary | ICD-10-CM | POA: Diagnosis not present

## 2023-06-19 DIAGNOSIS — M722 Plantar fascial fibromatosis: Secondary | ICD-10-CM | POA: Diagnosis not present

## 2023-06-19 DIAGNOSIS — L401 Generalized pustular psoriasis: Secondary | ICD-10-CM | POA: Diagnosis not present
